# Patient Record
Sex: Female | Born: 1935 | Race: White | Hispanic: No | Smoking: Never smoker
Health system: Southern US, Community
[De-identification: ages and names within clinical notes are randomized; demographics above are authoritative.]

## PROBLEM LIST (undated history)

## (undated) DIAGNOSIS — E871 Hypo-osmolality and hyponatremia: Secondary | ICD-10-CM

## (undated) DIAGNOSIS — Z8781 Personal history of (healed) traumatic fracture: Secondary | ICD-10-CM

## (undated) DIAGNOSIS — I452 Bifascicular block: Secondary | ICD-10-CM

## (undated) DIAGNOSIS — R001 Bradycardia, unspecified: Secondary | ICD-10-CM

## (undated) DIAGNOSIS — I10 Essential (primary) hypertension: Secondary | ICD-10-CM

## (undated) DIAGNOSIS — G2581 Restless legs syndrome: Secondary | ICD-10-CM

## (undated) DIAGNOSIS — I4892 Unspecified atrial flutter: Secondary | ICD-10-CM

## (undated) DIAGNOSIS — I2699 Other pulmonary embolism without acute cor pulmonale: Secondary | ICD-10-CM

## (undated) DIAGNOSIS — D509 Iron deficiency anemia, unspecified: Secondary | ICD-10-CM

## (undated) HISTORY — DX: Personal history of (healed) traumatic fracture: Z87.81

## (undated) HISTORY — DX: Unspecified atrial flutter: I48.92

## (undated) HISTORY — DX: Other pulmonary embolism without acute cor pulmonale: I26.99

## (undated) HISTORY — DX: Bradycardia, unspecified: R00.1

## (undated) HISTORY — DX: Iron deficiency anemia, unspecified: D50.9

## (undated) HISTORY — DX: Essential (primary) hypertension: I10

## (undated) HISTORY — DX: Bifascicular block: I45.2

## (undated) HISTORY — PX: PACEMAKER INSERTION: SHX728

## (undated) HISTORY — DX: Restless legs syndrome: G25.81

## (undated) HISTORY — DX: Hypo-osmolality and hyponatremia: E87.1

---

## 2005-07-22 ENCOUNTER — Emergency Department (HOSPITAL_COMMUNITY): Admission: EM | Admit: 2005-07-22 | Discharge: 2005-07-22 | Payer: Self-pay | Admitting: Emergency Medicine

## 2009-04-25 ENCOUNTER — Ambulatory Visit: Payer: Self-pay | Admitting: Cardiology

## 2009-04-26 ENCOUNTER — Encounter: Payer: Self-pay | Admitting: Cardiology

## 2009-04-27 ENCOUNTER — Encounter: Payer: Self-pay | Admitting: Cardiology

## 2009-04-27 ENCOUNTER — Ambulatory Visit: Payer: Self-pay | Admitting: Cardiology

## 2009-05-07 ENCOUNTER — Ambulatory Visit: Payer: Self-pay | Admitting: Cardiology

## 2009-05-10 ENCOUNTER — Encounter: Payer: Self-pay | Admitting: Cardiology

## 2009-05-11 ENCOUNTER — Telehealth (INDEPENDENT_AMBULATORY_CARE_PROVIDER_SITE_OTHER): Payer: Self-pay | Admitting: *Deleted

## 2009-05-28 ENCOUNTER — Encounter: Payer: Self-pay | Admitting: Cardiology

## 2009-06-02 ENCOUNTER — Ambulatory Visit: Payer: Self-pay | Admitting: Cardiology

## 2009-06-02 DIAGNOSIS — I495 Sick sinus syndrome: Secondary | ICD-10-CM

## 2009-06-08 ENCOUNTER — Telehealth (INDEPENDENT_AMBULATORY_CARE_PROVIDER_SITE_OTHER): Payer: Self-pay | Admitting: *Deleted

## 2009-06-12 ENCOUNTER — Ambulatory Visit: Payer: Self-pay | Admitting: Internal Medicine

## 2009-06-16 ENCOUNTER — Encounter: Payer: Self-pay | Admitting: Internal Medicine

## 2009-06-16 ENCOUNTER — Telehealth: Payer: Self-pay | Admitting: Internal Medicine

## 2009-06-17 HISTORY — PX: OTHER SURGICAL HISTORY: SHX169

## 2009-06-19 ENCOUNTER — Inpatient Hospital Stay (HOSPITAL_COMMUNITY): Admission: RE | Admit: 2009-06-19 | Discharge: 2009-06-20 | Payer: Self-pay | Admitting: Internal Medicine

## 2009-06-19 ENCOUNTER — Ambulatory Visit: Payer: Self-pay | Admitting: Internal Medicine

## 2009-06-20 ENCOUNTER — Encounter: Payer: Self-pay | Admitting: Internal Medicine

## 2009-06-25 ENCOUNTER — Encounter: Payer: Self-pay | Admitting: Internal Medicine

## 2009-06-26 ENCOUNTER — Ambulatory Visit: Payer: Self-pay | Admitting: Internal Medicine

## 2009-09-18 ENCOUNTER — Ambulatory Visit: Payer: Self-pay | Admitting: Internal Medicine

## 2009-09-18 DIAGNOSIS — I10 Essential (primary) hypertension: Secondary | ICD-10-CM | POA: Insufficient documentation

## 2010-02-16 NOTE — Progress Notes (Signed)
Summary: Wants referral to our EP   Phone Note Call from Patient Call back at [facility] Phone [phone]   Summary of Call: Pt was seen by Dr. Diona Browner d/t low HR. She has been instructed not to drive d/t monitor results. Plan per OV note was for pt to see primary MD for referral to [facility] or [facility]. Pt now requesting referral to our EP docs in [place]. She states she can't do anything on May 21 or June 1st. She also states it is okay to left message on her voicemail. Pt notified nurse will d/w Dr. Diona Browner and notify her of his recommendation.  Initial call taken by: Cyril Loosen, RN, BSN,  Jun 08, 2009 4:42 PM  Follow-up for Phone Call        OK.  Can make referral to our EP team in [facility] for assessment of sick sinus syndrome and discussion of the possibility of pacemaker placement.  Please make sure that the Cardionet strips are all available for review at that visit. Follow-up by: Loreli Slot, MD, [facility],  Jun 10, 2009 9:34 AM  Additional Follow-up for Phone Call Additional follow up Details #1::        Spoke with Asher Muir (scheduler at [facility]) 1st available w/EP is July. Notified Asher Muir, pt needs appt ASAP. Asher Muir will d/w Dennis Bast, RN and notify of appt time when scheduled. Pt is aware that we are working on appt and will notify her when appt scheduled. Cyril Loosen, RN, BSN  Jun 10, 2009 11:15 AM  Per Asher Muir, Dr. Johney Frame agreed to see pt in [place] on 5/27. Left message for pt to call pt regarding appt. Additional Follow-up by: Cyril Loosen, RN, BSN,  Jun 10, 2009 2:16 PM    Additional Follow-up for Phone Call Additional follow up Details #2::    Pt aware of appt on Friday, May 27th at 2pm Follow-up by: Cyril Loosen, RN, BSN,  Jun 10, 2009 3:25 PM

## 2010-02-16 NOTE — Progress Notes (Signed)
Summary: low heart rate   Phone Note Call from Patient   Summary of Call: Pt. called c/o having issue with heart rate.  States she is currently wearing a cardionet monitor.  Went to nursing home for visit and had episode where her HR dropped to 46 per the nurses who checked her.  Stated she just felt like her heart felt heavy and legs want to give out.  Has OV with [facility] on 5/17.  Call back to pt. and informed her that we did check her cardionet strips, but no recent reports in yet regarding epidode above. Advised her that she should not be driving or home alone as pt. stated that she really did not want to go back to the ER.  States that she does not have any dizziness or SOB with any of these episodes.  She does state to me that she has fallen 12 times prior to wearing this monitor.   Hoover Brunette, LPN  May 11, 2009 1:36 PM   Follow-up for Phone Call        Cardionet strips currently available printed and scanned in for review. Cyril Loosen, RN, BSN  May 11, 2009 3:55 PM   Additional Follow-up for Phone Call Additional follow up Details #1::        See my appended note on available Cardionet strips. Additional Follow-up by: Loreli Slot, MD, [facility],  May 11, 2009 5:01 PM    Additional Follow-up for Phone Call Additional follow up Details #2::    Pt notified per Dr. Ival Bible note. She states she has to drive. Notified pt that she cannot drive given her symptoms and medical history. She has been notified that this is for her safety as well as everyone else's on the road. Pt verbalized understanding.  Follow-up by: Cyril Loosen, RN, BSN,  May 12, 2009 4:58 PM

## 2010-02-16 NOTE — Progress Notes (Signed)
Summary: PPM implant  Phone Note Outgoing Call Call back at [facility] Phone [phone]   Call placed by: Gypsy Balsam RN BSN,  Jun 16, 2009 1:13 PM Summary of Call: Called patient and left message on machine to call about PPM implant.  Scheduled for 06-19-09 at 2:30PM with Dr Johney Frame.  Need to review instructions with patient. Gypsy Balsam RN BSN  Jun 16, 2009 1:14 PM   Follow-up for Phone Call        Instructions reviewed with patient. Gypsy Balsam RN BSN  Jun 16, 2009 3:49 PM

## 2010-02-16 NOTE — Letter (Signed)
Summary: Implantable Device Instructions  Architectural technologist, Main Office  [address]   [place], [place] [address]   Phone: [phone]  Fax: [phone]      Implantable Device Instructions  You are scheduled for:  __X___ Permanent Transvenous Pacemaker _____ Implantable Cardioverter Defibrillator _____ Implantable Loop Recorder _____ Generator Change  on FRIDAY, June 19, 2009 with Dr. Johney Frame.  1.  Please arrive at the Short Stay Center at [facility] at 12:30 PM on the day of your procedure.  2.  Do not eat or drink after 8:30 AM the day of your procedure.  Ok to have clear liquids before 8:30 AM.   3.  Complete lab work on 06-13-09.  The lab at [facility] is open from 8:30 AM to 1:30 PM and from 2:30 PM to 5:00 PM.  The lab at [facility] is open from 7:30 AM to 5:30 PM.  You do not have to be fasting.  4.  OK to take medications with water morning of procedure.  5.  Plan for an overnight stay.  Bring your insurance cards and a list of your medications.  6.  Wash your chest and neck with antibacterial soap (any brand) the evening before and the morning of your procedure.  Rinse well.    *If you have ANY questions after you get home, please call the office [phone].  *Every attempt is made to prevent procedures from being rescheduled.  Due to the nauture of Electrophysiology, rescheduling can happen.  The physician is always aware and directs the staff when this occurs.

## 2010-02-16 NOTE — Assessment & Plan Note (Signed)
Summary: pc2/jml   Visit Type:  Pacemaker check Primary Provider:  Dr. Romeo Rabon   History of Present Illness: The patient presents today for routine electrophysiology followup. She reports doing very well since last being seen in our clinic.   Her energy has significantly improved.  Her primary concern today is backpain.  She has chronic pain in her back for which she has contemplated surgery in the past.  The patient denies symptoms of palpitations, chest pain, shortness of breath,  lower extremity edema, dizziness, presyncope, syncope, or neurologic sequela. The patient is tolerating medications without difficulties and is otherwise without complaint today.   Preventive Screening-Counseling & Management  Alcohol-Tobacco     Smoking Status: never  Current Medications (verified): 1)  Alendronate Sodium 70 Mg Tabs (Alendronate Sodium) .... Take One By Mouth Weekly 2)  Amlodipine Besylate 10 Mg Tabs (Amlodipine Besylate) .... Take 1 Tablet By Mouth Once A Day 3)  Aspir-Trin 81 Mg Tbec (Aspirin) .... Take 2-4 Tablet By Mouth Once A Day 4)  Citalopram Hydrobromide 40 Mg Tabs (Citalopram Hydrobromide) .... Take 1 Tablet By Mouth Once A Day 5)  Clonazepam 1 Mg Tabs (Clonazepam) .... Take 1 Tablet By Mouth Once A Day 6)  Fish Oil 1000 Mg Caps (Omega-3 Fatty Acids) .... Take 2 Tablet By Mouth Once A Day 7)  Multivitamins  Tabs (Multiple Vitamin) .... Take 1 Tablet By Mouth Once A Day 8)  Primidone 50 Mg Tabs (Primidone) .... Take 1 Tablet By Mouth Once A Day 9)  Terazosin Hcl 2 Mg Caps (Terazosin Hcl) .... Take 1 Tablet By Mouth Once A Day 10)  Hydrochlorothiazide 12.5 Mg Caps (Hydrochlorothiazide) .... Take 1 Tablet By Mouth Once A Day 11)  Tylenol Extra Strength 500 Mg Tabs (Acetaminophen) .... As Needed 12)  Cyclobenzaprine Hcl 5 Mg Tabs (Cyclobenzaprine Hcl) .... Take 1 Tablet By Mouth Once A Day At Bedtime As Needed 13)  Amitiza 24 Mcg Caps (Lubiprostone) .... Take 1 Tablet By Mouth  Two Times A Day As Needed 14)  Multimineral Plus  Tabs (Multiple Vitamins-Minerals) .... Take 1 Tablet By Mouth Once A Day  Allergies (verified): 1)  ! Quinine 2)  ! * Tramadol 3)  ! Epinephrine 4)  ! Celebrex  Comments:  Nurse/Medical Assistant: The patient's medication list and allergies were reviewed with the patient and were updated in the Medication and Allergy Lists.  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 Head:  normocephalic and atraumatic Eyes:  PERRLA/EOM intact; conjunctiva and lids normal. Mouth:  Teeth, gums and palate normal. Oral mucosa normal. Neck:  supple Chest Wall:  pacemaker pocket is well healed Lungs:  Clear bilaterally to auscultation and percussion. Heart:  Non-displaced PMI, chest non-tender; regular rate and rhythm, S1, S2 without murmurs, rubs or gallops. Carotid upstroke normal, no bruit. Normal abdominal aortic size, no bruits. Femorals normal pulses, no bruits. Pedals normal pulses. No edema, no varicosities. Abdomen:  Bowel sounds positive; abdomen soft and non-tender without masses, organomegaly, or hernias noted. No hepatosplenomegaly. Pulses:  pulses normal in all 4 extremities Extremities:  No clubbing or cyanosis. Neurologic:  Alert and oriented x 3.   PPM Specifications Following MD:  Hillis Range, MD     PPM Vendor:  St Jude     PPM Model Number:  [id number]     PPM Serial Number:  [id number] DOI:  06/19/2009     PPM Implanting MD:  Hillis Range,  MD  Lead 1    Location: RA     DOI: 06/19/2009     Model #: 1888TC     Serial #: [id number]     Status: active Lead 2    Location: RV     DOI: 06/19/2009     Model #: 1888TC     Serial #: [id number]     Status: active  Magnet Response Rate:  BOL 100 ERI  85  Indications:  Sick sinus syndrome   PPM Follow Up Battery Voltage:  2.99 V     Battery Est. Longevity:  8.6-10.7 yrs     Pacer Dependent:  Yes       PPM Device Measurements Atrium  Amplitude: 4.7 mV, Impedance: 410 ohms, Threshold: 0.75 V at 0.5 msec Right Ventricle  Amplitude: 10.8 mV, Impedance: 410 ohms, Threshold: 0.75 V at 0.5 msec  Episodes MS Episodes:  3     Percent Mode Switch:  <1%     Coumadin:  No Ventricular High Rate:  0     Atrial Pacing:  52%     Ventricular Pacing:  <1%  Parameters Mode:  DDDR     Lower Rate Limit:  60     Upper Rate Limit:  110 Paced AV Delay:  250     Sensed AV Delay:  250 Next Cardiology Appt Due:  06/18/2010 Tech Comments:  NORMAL DEVICE FUNCTION.  CHANGED RA OUTPUT FROM 3.5 TO 2.0 AND RV OUTPUT FROM 3.5 TO 2.5 V.  ROV IN JUNE W/JA. Vella Kohler  September 18, 2009 3:27 PM MD Comments:  agree  Impression & Recommendations:  Problem # 1:  SICK SINUS SYNDROME (ICD-427.81) stable normal pacemaker function changes as above  Problem # 2:  ESSENTIAL HYPERTENSION, BENIGN (ICD-401.1) stable no changes today  Patient Instructions: 1)  return 6/12

## 2010-02-16 NOTE — Procedures (Signed)
Summary: wound check/ per dr Io Dieujuste/jml   Current Medications (verified): 1)  Alendronate Sodium 70 Mg Tabs (Alendronate Sodium) .... Take One By Mouth Weekly 2)  Amlodipine Besylate 10 Mg Tabs (Amlodipine Besylate) .... Take 1 Tablet By Mouth Once A Day 3)  Aspir-Trin 81 Mg Tbec (Aspirin) .... Take 2 Tablet By Mouth Once A Day 4)  Citalopram Hydrobromide 40 Mg Tabs (Citalopram Hydrobromide) .... Take 1 Tablet By Mouth Once A Day 5)  Clonazepam 0.5 Mg Tabs (Clonazepam) .... Take 1/2  Tablet By Mouth At Bedtime 6)  Fish Oil 1000 Mg Caps (Omega-3 Fatty Acids) .... Take 2 Tablet By Mouth Once A Day 7)  Multivitamins  Tabs (Multiple Vitamin) .... Take 1 Tablet By Mouth Once A Day 8)  Primidone 50 Mg Tabs (Primidone) .... Take 1 Tablet By Mouth Once A Day 9)  Terazosin Hcl 2 Mg Caps (Terazosin Hcl) .... Take 1 Tablet By Mouth Once A Day 10)  Hydrochlorothiazide 12.5 Mg Caps (Hydrochlorothiazide) .... Take 1 Tablet By Mouth Once A Day 11)  Cvs Leg Cramps .... Take 1 Tablet By Mouth Once A Day As Needed 12)  Coral Calcium 500 Mg Caps (Coral Calcium) .... Take 1 Tablet By Mouth Once A Day 13)  Tylenol Extra Strength 500 Mg Tabs (Acetaminophen) .... As Needed 14)  Cyclobenzaprine Hcl 5 Mg Tabs (Cyclobenzaprine Hcl) .... Take 1 Tablet By Mouth Once A Day At Bedtime  Allergies (verified): 1)  ! Quinine 2)  ! * Tramadol 3)  ! Epinephrine 4)  ! Celebrex  PPM Specifications Following MD:  Hillis Range, MD     PPM Vendor:  St Jude     PPM Model Number:  [phone]     PPM Serial Number:  [id number] PPM DOI:  06/19/2009     PPM Implanting MD:  Hillis Range, MD  Lead 1    Location: RA     DOI: 06/19/2009     Model #: 1888TC     Serial #: [id number]     Status: active Lead 2    Location: RV     DOI: 06/19/2009     Model #: 1888TC     Serial #: [id number]     Status: active  Magnet Response Rate:  BOL 100 ERI  85  Indications:  Sick sinus syndrome   PPM Follow Up Remote Check?  No Battery Voltage:  3.04  V     Battery Est. Longevity:  5.0 years     Pacer Dependent:  Yes       PPM Device Measurements Atrium  Amplitude: 2.0 mV, Impedance: 390 ohms, Threshold: 0.75 V at 0.5 msec Right Ventricle  Amplitude: 10.2 mV, Impedance: 450 ohms, Threshold: 0.75 V at 0.5 msec  Episodes MS Episodes:  0     Percent Mode Switch:  0     Coumadin:  No Atrial Pacing:  64%     Ventricular Pacing:  1.6%  Parameters Mode:  DDDR     Lower Rate Limit:  60     Upper Rate Limit:  110 Paced AV Delay:  250     Sensed AV Delay:  250 Tech Comments:  No parameter changes.  Device function normal.  Steri strips removed.  No redness, small amount edema noted.  Activity restrictions reviewed with the patient.  ROV 3 months with Dr. Johney Frame in [place]. Altha Harm, LPN  June 26, 2009 11:42 AM

## 2010-02-16 NOTE — Procedures (Signed)
Summary: Holter and Event/ CARDIONET END OF SERVICE SUMMARY  Holter and Event/ CARDIONET END OF SERVICE SUMMARY   Imported By: Dorise Hiss 06/09/2009 10:58:57  _____________________________________________________________________  External Attachment:    Type:   Image     Comment:   External Document

## 2010-02-16 NOTE — Assessment & Plan Note (Signed)
Summary: NEW EVAL; SSS; DISCUSS PACER-JM   Visit Type:  Initial Consult Primary Provider:  Dr. Romeo Rabon   History of Present Illness: Amber [name] is a pleasant 77 year old WF with symptomatic bradycardia who presents today for EP consultation.  She was seen in consultation by Dr Diona Browner back in April at [facility] with a history of bradycardia and reported falls/syncope. She had undergone prior evaluation by Dr. Hyacinth Meeker in [place]. Followup echocardiography is reviewed below, revealing normal LVEF without major abnormalities. She reports that over the past year, she has had worsening fatigue.  She reports intermittent episodes of weakness.  She reports presyncope for which she has fallen.  She denies frank syncope. The patient was provided a CardioNet monitor. She had episodes of bradycardia, specifically junctional rhythm with heart rates into the 30s, some pauses no greater than 2.2 seconds, and brief bursts of PACs and atrial tachycardia, as well as a single episode of NSVT that was asymptomatic. She denies CP, palpitations, SOB, or other concerns.  Preventive Screening-Counseling & Management  Alcohol-Tobacco     Smoking Status: never  Current Medications (verified): 1)  Alendronate Sodium 70 Mg Tabs (Alendronate Sodium) .... Take One By Mouth Weekly 2)  Amlodipine Besylate 5 Mg Tabs (Amlodipine Besylate) .... Take 1 Tablet By Mouth Once A Day 3)  Aspir-Trin 325 Mg Tbec (Aspirin) .... Take 1 Tablet By Mouth Once A Day 4)  Citalopram Hydrobromide 40 Mg Tabs (Citalopram Hydrobromide) .... Take 1 Tablet By Mouth Once A Day 5)  Clonazepam 0.5 Mg Tabs (Clonazepam) .... Take 1 Tablet By Mouth Once A Day\par 6)  Fish Oil 1000 Mg Caps (Omega-3 Fatty Acids) .... Take 2 Tablet By Mouth Once A Day 7)  Multivitamins  Tabs (Multiple Vitamin) .... Take 1 Tablet By Mouth Once A Day 8)  Primidone 50 Mg Tabs (Primidone) .... Take 1 Tablet By Mouth Once A Day 9)  Terazosin Hcl 2 Mg  Caps (Terazosin Hcl) .... Take 1 Tablet By Mouth Once A Day 10)  Hydrochlorothiazide 12.5 Mg Caps (Hydrochlorothiazide) .... Take 1 Tablet By Mouth Once A Day 11)  Cvs Leg Cramps .... Take 1 Tablet By Mouth Once A Day As Needed 12)  Coral Calcium 500 Mg Caps (Coral Calcium) .... Take 1 Tablet By Mouth Once A Day 13)  Tylenol Extra Strength 500 Mg Tabs (Acetaminophen) .... As Needed 14)  Cyclobenzaprine Hcl 5 Mg Tabs (Cyclobenzaprine Hcl) .... Take 1 Tablet By Mouth Once A Day At Bedtime  Allergies (verified): 1)  ! Quinine 2)  ! * Tramadol 3)  ! Epinephrine 4)  ! Celebrex  Comments:  Nurse/Medical Assistant: The patient is currently on medications but does not know the name or dosage at this time. Instructed to contact our office with details. Will update medication list at that time.  Past History:  Past Medical History: Hypertension Restless leg syndrome Bradycardia - followed by Dr. Hyacinth Meeker in past [facility]) NSVT Vertebral fractures  Past Surgical History: Reviewed history from 06/01/2009 and no changes required. Right lower leg vein stripping  Family History: Reviewed history from 06/01/2009 and no changes required. Noncontributory  Social History: Reviewed history from 06/01/2009 and no changes required. Lives in [place] Tobacco Use - No Alcohol Use - no Regular Exercise - yes  Review of Systems       All systems are reviewed and negative except as listed in the HPI.   Vital Signs:  Patient profile:   77 year old female Height:  61 inches Weight:      133 pounds Pulse rate:   56 / minute BP sitting:   138 / 64  (left arm) Cuff size:   regular  Vitals Entered By: Carlye Grippe (Jun 12, 2009 1:45 PM)  Physical Exam  General:  Well developed, well nourished, in no acute distress. Head:  normocephalic and atraumatic Eyes:  PERRLA/EOM intact; conjunctiva and lids normal. Nose:  no deformity, discharge, inflammation, or lesions Mouth:  Teeth, gums  and palate normal. Oral mucosa normal. Neck:  Neck supple, no JVD. No masses, thyromegaly or abnormal cervical nodes. Lungs:  Clear bilaterally to auscultation and percussion. Heart:  Non-displaced PMI, chest non-tender; regular rate and rhythm, S1, S2 without murmurs, rubs or gallops. Carotid upstroke normal, no bruit. Normal abdominal aortic size, no bruits. Femorals normal pulses, no bruits. Pedals normal pulses. No edema, no varicosities. Abdomen:  Bowel sounds positive; abdomen soft and non-tender without masses, organomegaly, or hernias noted. No hepatosplenomegaly. Msk:  Back normal, normal gait. Muscle strength and tone normal. Pulses:  pulses normal in all 4 extremities Extremities:  No clubbing or cyanosis. Neurologic:  Alert and oriented x 3. Skin:  Intact without lesions or rashes. Cervical Nodes:  no significant adenopathy Psych:  Normal affect.   Echocardiogram  Procedure date:  04/27/2009  Findings:      LVEF 60-65%, mild right ventricular enlargement, moderate RAE, trace MR, trace AR, mild TR, trivial pericardial effusion.    EKG  Procedure date:  04/26/2009  Findings:      sinus rhythm PR 200, incomplete RBBB (QRS 128), inferior infarction  Impression & Recommendations:  Problem # 1:  SICK SINUS SYNDROME (ICD-427.81) The patient has symptomatic bradycardia with fatigue and weakness.  She has had presyncope and multiple falls.  I have reviewed her event monitor which reveals junctional bradycardia with heart rates at times in the 30s.  TFTs are normal.   I would therefore recommend pacemaker implantation.  Risks, benefits, alternatives to pacemaker implantation were discussed in detail with the patient today. She understands that the risks include but are not limited to bleeding, infection, pneumothorax, perforation, tamponade, vascular damage, renal failure, MI, stroke, death, and lead dislodgement.   She accepts these risks and wishes to proceed.  We will therefore  plan PPM implant at the next available time.  Problem # 2:  VENTRICULAR TACHYCARDIA (ICD-427.1) On monitor, she had nonsustained VT without symptoms.  Her EF is preserved.  I do not feel this is the cause for the above symptoms.  We could consider beta blocker therapy once pacemaker is implanted.  Other Orders: T-Basic Metabolic Panel [phone]) T-CBC w/Diff [phone]) T-Protime, Auto [id number]) T-PTT [phone])

## 2010-02-16 NOTE — Assessment & Plan Note (Signed)
Summary: EPH- POST [facility]   Visit Type:  Follow-up Primary Provider:  Dr. Romeo Rabon   History of Present Illness: 77 year old woman presents for a followup visit. She was seen in consultation back in April at [facility] with a history of bradycardia and reported falls/syncope. She had undergone prior evaluation by Dr. Hyacinth Meeker in [place]. Followup echocardiography is reviewed below, revealing normal LVEF without major abnormalities.  The patient was provided a CardioNet monitor. She had episodes of bradycardia, specifically junctional rhythm with heart rates into the 30s, some pauses no greater than 2.2 seconds, and brief bursts of PACs and atrial tachycardia, as well as what appears to be a single episode of NSVT that was asymptomatic. She does not report any frank syncope during the time that she was wearing a monitor although did have some "spells" when she felt very weak. She denies any anginal chest pain. She has NYHA class II dyspnea and exertion.  Reviewed with her in detail the results of her testing to date. She likely has conduction system disease and an element of sick sinus syndrome, perhaps contributing to at least some of her symptomatology. The details of her falls and possible syncope are not entirely clear, however it is possible that she has had more profound bradycardia or longer pauses at these times.  I spoke with her about the possibility of proceeding with a stress test to rule out ischemic heart disease in light of documentation of asymptomatic nonsustained ventricular tachycardia with normal LVEF. We also discussed referral to one of our electrophysiologists to review the potential possibility of pacemaker implantation.  After considering the matter, she stated that she preferred to speak with Dr. Oscar La, and be referred for further evaluation at either [facility] or [facility]. She was for some reason hesitant to have an evaluation done in [place], the  site of our primary practice.  Clinical Review Panels:  Echocardiogram Echocardiogram LVEF 60-65%, mild right ventricular enlargement, moderate RAE, trace MR, trace AR, mild TR, trivial pericardial effusion. (04/27/2009)    Preventive Screening-Counseling & Management  Alcohol-Tobacco     Smoking Status: never  Current Medications (verified): 1)  Alendronate Sodium 70 Mg Tabs (Alendronate Sodium) .... Take One By Mouth Weekly 2)  Amlodipine Besylate 5 Mg Tabs (Amlodipine Besylate) .... Take 1 Tablet By Mouth Once A Day 3)  Aspir-Trin 325 Mg Tbec (Aspirin) .... Take 1 Tablet By Mouth Once A Day 4)  Citalopram Hydrobromide 40 Mg Tabs (Citalopram Hydrobromide) .... Take 1 Tablet By Mouth Once A Day 5)  Clonazepam 0.5 Mg Tabs (Clonazepam) .... Take 1 Tablet By Mouth Once A Day\par 6)  Fish Oil 1000 Mg Caps (Omega-3 Fatty Acids) .... Take 2 Tablet By Mouth Once A Day 7)  Multivitamins  Tabs (Multiple Vitamin) .... Take 1 Tablet By Mouth Once A Day 8)  Primidone 50 Mg Tabs (Primidone) .... Take 1 Tablet By Mouth Once A Day 9)  Terazosin Hcl 2 Mg Caps (Terazosin Hcl) .... Take 1 Tablet By Mouth Once A Day 10)  Hydrochlorothiazide 12.5 Mg Caps (Hydrochlorothiazide) .... Take 1 Tablet By Mouth Once A Day 11)  Cvs Leg Cramps .... Take 1 Tablet By Mouth Once A Day As Needed 12)  Coral Calcium 500 Mg Caps (Coral Calcium) .... Take 1 Tablet By Mouth Once A Day 13)  Tylenol Extra Strength 500 Mg Tabs (Acetaminophen) .... As Needed  Allergies (verified): 1)  ! Quinine 2)  ! * Tramadol 3)  ! Epinephrine 4)  !  Celebrex  Comments:  Nurse/Medical Assistant: The patient's medications and allergies were reviewed with the patient and were updated in the Medication and Allergy Lists. List reviewed.  Past History:  Past Medical History: Last updated: 06/01/2009 Hypertension Restless leg syndrome Bradycardia - followed by Dr. Hyacinth Meeker in past [facility]) Syncope Vertebral fractures  Past  Surgical History: Last updated: 06/01/2009 Right lower leg vein stripping  Family History: Last updated: 06/01/2009 Noncontributory  Social History: Last updated: 06/01/2009 Lives in [place] Tobacco Use - No Alcohol Use - no Regular Exercise - yes  Review of Systems  The patient denies anorexia, fever, weight loss, chest pain, syncope, peripheral edema, prolonged cough, headaches, abdominal pain, melena, hematochezia, and severe indigestion/heartburn.         She cites problems with restless legs. Otherwise reviewed and negative except as outlined.  Vital Signs:  Patient profile:   77 year old female Height:      61 inches Weight:      134 pounds BMI:     25.41 Pulse rate:   60 / minute BP sitting:   159 / 74  (left arm) Cuff size:   regular  Vitals Entered By: Carlye Grippe (Jun 02, 2009 9:56 AM)  Physical Exam  Additional Exam:  Well-nourished elderly woman in no acute distress. HEENT: Conjunctiva and lids normal, oropharynx clear. Neck: Supple, no elevated JVP. Lungs: Clear to auscultation, nonlabored. Cardiac: Regular rate and rhythm, soft systolic murmur at the base, preserved second heart sound, no S3. Abdomen: Soft, nontender, bowel sounds present. Skin: Warm and dry. Extremities: No pitting edema, distal pulses 2+. Musculoskeletal: No gross deformities. Neuropsychiatric: Alert and oriented x3, affect grossly appropriate.    Impression & Recommendations:  Problem # 1:  SICK SINUS SYNDROME (ICD-427.81)  CardioNet monitor reveals evidence of episodic bradycardia, specifically with junctional rhythm in the 30s, some pauses no greater than 2.2 seconds, and brief bursts of atrial tachycardia. Certainly suspicious for conduction system disease, and potentially a contributor to some of her spells including episodic weakness and reported history of falls with potential syncope. She denies having any frank syncope during the time she wore the monitor. It is  possible that we did not capture more profound episodes of bradycardia or longer pulses. I reviewed this with her in detail and recommended that she have referral to one of our electrophysiologists to discuss the potential for pacemaker implantation (perhaps even EPS with SSS and apparent NSVT). She states that she would prefer to have evaluation done either through [facility] or [facility]. I recommended that she see her primary care physician Dr. Oscar La to make a referral. We can certainly send records of her evaluation to date if requested. Otherwise if she changes her mind, we can certainly see her back and proceed from there. I have reminded her that she should ideally not be driving with this current history.  Her updated medication list for this problem includes:    Amlodipine Besylate 5 Mg Tabs (Amlodipine besylate) .Marland Kitchen... Take 1 tablet by mouth once a day    Aspir-trin 325 Mg Tbec (Aspirin) .Marland Kitchen... Take 1 tablet by mouth once a day  Problem # 2:  VENTRICULAR TACHYCARDIA (ICD-427.1)  Possibly an aberrant SVT, although asymptomatic NSVT appears to be the case. LVEF is normal by recent echocardiogram. I did discuss with her proceeding with a basic ischemic evaluation via stress testing. She is not reporting any angina. She was hesitant to pursue this as well. Perhaps this can be further reviewed with  her in electrophysiology followup at the facility of her choice.  Her updated medication list for this problem includes:    Amlodipine Besylate 5 Mg Tabs (Amlodipine besylate) .Marland Kitchen... Take 1 tablet by mouth once a day    Aspir-trin 325 Mg Tbec (Aspirin) .Marland Kitchen... Take 1 tablet by mouth once a day  Patient Instructions: 1)  Follow up with Dr. Oscar La for referral to either [facility] or [facility] at patient request. 2)  Follow up with [place] if needed.

## 2010-02-16 NOTE — Miscellaneous (Signed)
Summary: Device preload  Clinical Lists Changes  Observations: Added new observation of PPM INDICATN: Sick sinus syndrome (06/25/2009 13:23) Added new observation of MAGNET RTE: BOL 100 ERI  85 (06/25/2009 13:23) Added new observation of PPMLEADSTAT2: active (06/25/2009 13:23) Added new observation of PPMLEADSER2: [id number] (06/25/2009 13:23) Added new observation of PPMLEADMOD2: 1888TC (06/25/2009 13:23) Added new observation of PPMLEADLOC2: RV (06/25/2009 13:23) Added new observation of PPMLEADSTAT1: active (06/25/2009 13:23) Added new observation of PPMLEADSER1: [id number] (06/25/2009 13:23) Added new observation of PPMLEADMOD1: 1888TC (06/25/2009 13:23) Added new observation of PPMLEADLOC1: RA (06/25/2009 13:23) Added new observation of PPM IMP MD: Hillis Range, MD (06/25/2009 13:23) Added new observation of PPMLEADDOI2: 06/19/2009 (06/25/2009 13:23) Added new observation of PPMLEADDOI1: 06/19/2009 (06/25/2009 13:23) Added new observation of PPM DOI: 06/19/2009 (06/25/2009 13:23) Added new observation of PPM SERL#: [id number]  (06/25/2009 13:23) Added new observation of PPM MODL#: [id number]  (06/25/2009 44:03) Added new observation of PACEMAKERMFG: St Jude  (06/25/2009 13:23) Added new observation of PACEMAKER MD: Hillis Range, MD  (06/25/2009 13:23)      PPM Specifications Following MD:  Hillis Range, MD     PPM Vendor:  St Jude     PPM Model Number:  [id number]     PPM Serial Number:  [id number] PPM DOI:  06/19/2009     PPM Implanting MD:  Hillis Range, MD  Lead 1    Location: RA     DOI: 06/19/2009     Model #: 1888TC     Serial #: [id number]     Status: active Lead 2    Location: RV     DOI: 06/19/2009     Model #: 1888TC     Serial #: [id number]     Status: active  Magnet Response Rate:  BOL 100 ERI  85  Indications:  Sick sinus syndrome

## 2010-02-16 NOTE — Cardiovascular Report (Signed)
Summary: Card Device Clinic/ [facility] SUMMARY  Card Device Clinic/ [facility] SUMMARY   Imported By: Dorise Hiss 09/24/2009 10:38:03  _____________________________________________________________________  External Attachment:    Type:   Image     Comment:   External Document

## 2010-02-16 NOTE — Procedures (Signed)
Summary: Cardionet Strips   Cardionet Strips   Imported By: Cyril Loosen, RN, BSN 05/11/2009 15:54:51  _____________________________________________________________________  External Attachment:    Type:   Image     Comment:   External Document  Appended Document: Cardionet Strips  Reviewed - some sinus brady with only 2.1 second pause (asymptomatic ?) and one brief episode of junctional or idioventricular rhythm.  No syncope.  Conduction disease is suspected with OV already arranged.  Continue monitoring.  No driving.  Appended Document: Cardionet Strips     Pt notified per Dr. Ival Bible note. She states she has to drive. Notified pt that she cannot drive given her symptoms and medical history. She has been notified that this is for her safety as well as everyone else's on the road. Pt verbalized understanding.  Follow-up by: Cyril Loosen, RN, BSN,  May 12, 2009 4:58 PM

## 2010-02-16 NOTE — Cardiovascular Report (Signed)
Summary: Office Visit   Office Visit   Imported By: Roderic Ovens 07/17/2009 15:51:12  _____________________________________________________________________  External Attachment:    Type:   Image     Comment:   External Document

## 2010-02-16 NOTE — Consult Note (Signed)
Summary: CARDIOLOGY CONSULT/ [facility]  CARDIOLOGY CONSULT/ [facility]   Imported By: Zachary George 06/01/2009 16:23:09  _____________________________________________________________________  External Attachment:    Type:   Image     Comment:   External Document

## 2010-04-05 LAB — SURGICAL PCR SCREEN: Staphylococcus aureus: NEGATIVE

## 2010-06-23 ENCOUNTER — Encounter: Payer: Self-pay | Admitting: *Deleted

## 2010-06-24 ENCOUNTER — Ambulatory Visit (INDEPENDENT_AMBULATORY_CARE_PROVIDER_SITE_OTHER): Payer: Medicare Other | Admitting: Internal Medicine

## 2010-06-24 ENCOUNTER — Encounter: Payer: Self-pay | Admitting: Internal Medicine

## 2010-06-24 DIAGNOSIS — I10 Essential (primary) hypertension: Secondary | ICD-10-CM

## 2010-06-24 DIAGNOSIS — I495 Sick sinus syndrome: Secondary | ICD-10-CM

## 2010-06-24 NOTE — Assessment & Plan Note (Signed)
Normal pacemaker function See Pace Art report No changes today  

## 2010-06-24 NOTE — Assessment & Plan Note (Signed)
Stable No change required today  

## 2010-06-24 NOTE — Progress Notes (Signed)
The patient presents today for routine electrophysiology followup.  Since last being seen in our clinic, the patient reports doing very well.  Today, she denies symptoms of palpitations, chest pain, shortness of breath, orthopnea, PND, lower extremity edema, dizziness, presyncope, syncope, or neurologic sequela.  The patient feels that she is tolerating medications without difficulties and is otherwise without complaint today.   Past Medical History  Diagnosis Date  . Hypertension   . Restless leg syndrome   . 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 History of recurrent vertebral fractures   . Right bundle branch block and left posterior fascicular block    Past Surgical History  Procedure Date  . Right lower leg vein stripping   . Sjm ppm 06/11    by JA for SSS and syncope    Current Outpatient Prescriptions  Medication Sig Dispense Refill  . amLODipine (NORVASC) 10 MG tablet Take 10 mg by mouth daily.        . citalopram (CELEXA) 40 MG tablet Take 40 mg by mouth daily.        . clonazePAM (KLONOPIN) 1 MG tablet Take 1 mg by mouth daily.        Marland Kitchen docusate sodium (COLACE) 50 MG capsule Take by mouth 2 (two) times daily.        . hydrochlorothiazide (,MICROZIDE/HYDRODIURIL,) 12.5 MG capsule Take 12.5 mg by mouth daily.        . Melatonin 5 MG TABS Take 2 tablets by mouth at bedtime.        . Multiple Vitamin (MULTIVITAMIN) tablet Take 1 tablet by mouth daily.        . primidone (MYSOLINE) 50 MG tablet Take 50 mg by mouth daily.        Marland Kitchen terazosin (HYTRIN) 2 MG capsule Take 2 mg by mouth at bedtime.        Marland Kitchen DISCONTD: clonazePAM (KLONOPIN) 1 MG tablet Take 1 mg by mouth daily.        Marland Kitchen DISCONTD: acetaminophen (TYLENOL) 500 MG tablet Take 500 mg by mouth daily.        Marland Kitchen DISCONTD: alendronate (FOSAMAX) 70 MG tablet Take 70 mg by mouth every 7 (seven) days. Take with a full glass of water on an empty stomach.       . DISCONTD: aspirin 81 MG tablet Take 81 mg by mouth daily. Takes 2-4 daily        . DISCONTD: cyclobenzaprine (FLEXERIL) 5 MG tablet Take 5 mg by mouth daily as needed.        Marland Kitchen DISCONTD: fish oil-omega-3 fatty acids 1000 MG capsule Take 2 g by mouth daily.        Marland Kitchen DISCONTD: lubiprostone (AMITIZA) 24 MCG capsule Take 24 mcg by mouth 2 (two) times daily with a meal.        . DISCONTD: Multiple Vitamins-Minerals (MULTIMINERAL PLUS PO) Take 1 tablet by mouth daily.          Allergies  Allergen Reactions  . Celecoxib     REACTION: hearing loss  . Epinephrine   . Quinine     REACTION: rash  . Tramadol     History   Social History  . Marital Status: Married    Spouse Name: N/A    Number of Children: N/A  . Years of Education: N/A   Occupational History  . RETIRED    Social History Main Topics  . Smoking status: Never Smoker   . Smokeless  tobacco: Never Used  . Alcohol Use: No  . Drug Use: No  . Sexually Active: Not on file   Other Topics Concern  . Not on file   Social History Narrative   Lives in [place].Spouse is a disabled minister s/p CVA.  He now resides in a nursing home in [place].   Physical Exam: Filed Vitals:   06/24/10 1525  BP: 130/70  Pulse: 62  Height: 5\' 1"  (1.549 m)  Weight: 128 lb (58.06 kg)    GEN- The patient is well appearing, alert and oriented x 3 today.   Head- normocephalic, atraumatic Eyes-  Sclera clear, conjunctiva pink Ears- hearing intact Oropharynx- clear Neck- supple, no JVP Lymph- no cervical lymphadenopathy Lungs- Clear to ausculation bilaterally, normal work of breathing Chest- pacemaker pocket is well healed Heart- Regular rate and rhythm, no murmurs, rubs or gallops, PMI not laterally displaced GI- soft, NT, ND, + BS Extremities- no clubbing, cyanosis, or edema MS- no significant deformity or atrophy Skin- no rash or lesion Psych- euthymic mood, full affect Neuro- strength and sensation are intact  Pacemaker interrogation- reviewed in detail today,  See PACEART report  Assessment and  Plan:

## 2010-09-23 ENCOUNTER — Other Ambulatory Visit: Payer: Self-pay

## 2010-09-23 ENCOUNTER — Encounter: Payer: Self-pay | Admitting: Internal Medicine

## 2010-09-23 ENCOUNTER — Ambulatory Visit (INDEPENDENT_AMBULATORY_CARE_PROVIDER_SITE_OTHER): Payer: Medicare Other | Admitting: *Deleted

## 2010-09-23 DIAGNOSIS — I495 Sick sinus syndrome: Secondary | ICD-10-CM

## 2010-09-23 LAB — REMOTE PACEMAKER DEVICE
ATRIAL PACING PM: 69
BAMS-0003: 70 {beats}/min
RV LEAD IMPEDENCE PM: 390 Ohm
VENTRICULAR PACING PM: 1

## 2010-10-04 ENCOUNTER — Encounter: Payer: Self-pay | Admitting: *Deleted

## 2010-10-04 NOTE — Progress Notes (Signed)
Pacer remote check  

## 2010-12-23 ENCOUNTER — Ambulatory Visit (INDEPENDENT_AMBULATORY_CARE_PROVIDER_SITE_OTHER): Payer: Medicare Other | Admitting: *Deleted

## 2010-12-23 DIAGNOSIS — I495 Sick sinus syndrome: Secondary | ICD-10-CM

## 2010-12-24 ENCOUNTER — Encounter: Payer: Self-pay | Admitting: Internal Medicine

## 2010-12-24 ENCOUNTER — Other Ambulatory Visit: Payer: Self-pay | Admitting: Internal Medicine

## 2010-12-24 LAB — REMOTE PACEMAKER DEVICE
AL AMPLITUDE: 3.8 mv
AL IMPEDENCE PM: 430 Ohm
BAMS-0003: 70 {beats}/min
BATTERY VOLTAGE: 2.99 V
DEVICE MODEL PM: 7136330
RV LEAD AMPLITUDE: 7.8 mv
RV LEAD IMPEDENCE PM: 400 Ohm

## 2011-01-06 NOTE — Progress Notes (Signed)
Remote pacer check  

## 2011-01-13 ENCOUNTER — Encounter: Payer: Self-pay | Admitting: *Deleted

## 2011-03-31 ENCOUNTER — Encounter: Payer: Self-pay | Admitting: Internal Medicine

## 2011-03-31 ENCOUNTER — Ambulatory Visit (INDEPENDENT_AMBULATORY_CARE_PROVIDER_SITE_OTHER): Payer: Medicare Other | Admitting: *Deleted

## 2011-03-31 DIAGNOSIS — I495 Sick sinus syndrome: Secondary | ICD-10-CM

## 2011-03-31 LAB — REMOTE PACEMAKER DEVICE
ATRIAL PACING PM: 53
RV LEAD IMPEDENCE PM: 440 Ohm
VENTRICULAR PACING PM: 1

## 2011-04-05 ENCOUNTER — Encounter: Payer: Self-pay | Admitting: *Deleted

## 2011-04-07 NOTE — Progress Notes (Signed)
Remote pacer check  

## 2011-07-14 ENCOUNTER — Ambulatory Visit (INDEPENDENT_AMBULATORY_CARE_PROVIDER_SITE_OTHER): Payer: Medicare Other | Admitting: Internal Medicine

## 2011-07-14 ENCOUNTER — Encounter: Payer: Self-pay | Admitting: Internal Medicine

## 2011-07-14 VITALS — BP 154/76 | HR 69 | Resp 18 | Ht 61.0 in | Wt 125.0 lb

## 2011-07-14 DIAGNOSIS — I10 Essential (primary) hypertension: Secondary | ICD-10-CM

## 2011-07-14 DIAGNOSIS — I495 Sick sinus syndrome: Secondary | ICD-10-CM

## 2011-07-14 LAB — PACEMAKER DEVICE OBSERVATION
AL AMPLITUDE: 3.8 mv
AL THRESHOLD: 1 V
RV LEAD IMPEDENCE PM: 375 Ohm
RV LEAD THRESHOLD: 1 V

## 2011-07-14 NOTE — Assessment & Plan Note (Signed)
Normal pacemaker function See Pace Art report No changes today  

## 2011-07-14 NOTE — Progress Notes (Signed)
PCP: Romeo Rabon, MD  Amber [name] is a 78 y.o. female who presents today for routine electrophysiology followup.  Since last being seen in our clinic, the patient reports doing very well.  Today, she denies symptoms of palpitations, chest pain, shortness of breath,  lower extremity edema, dizziness, presyncope, or syncope.  The patient is otherwise without complaint today.   Past Medical History  Diagnosis Date  . Hypertension   . Restless leg syndrome   . 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 History of recurrent vertebral fractures   . Right bundle branch block and left posterior fascicular block    Past Surgical History  Procedure Date  . Right lower leg vein stripping   . Sjm ppm 06/11    by JA for SSS and syncope    Current Outpatient Prescriptions  Medication Sig Dispense Refill  . acetaminophen (TYLENOL) 500 MG tablet Take 500 mg by mouth every 6 (six) hours as needed.      Marland Kitchen amLODipine (NORVASC) 10 MG tablet Take 10 mg by mouth daily.        Marland Kitchen aspirin 325 MG tablet Take 325 mg by mouth daily.      . citalopram (CELEXA) 40 MG tablet Take 40 mg by mouth daily.        . clonazePAM (KLONOPIN) 1 MG tablet Take 1 mg by mouth daily.        Marland Kitchen docusate sodium (COLACE) 50 MG capsule Take by mouth 2 (two) times daily.        . ferrous fumarate (HEMOCYTE - 106 MG FE) 325 (106 FE) MG TABS Take 1 tablet by mouth 2 (two) times daily.      . Melatonin 5 MG TABS Take 2 tablets by mouth at bedtime.       . Multiple Vitamin (MULTIVITAMIN) tablet Take 1 tablet by mouth daily.        Marland Kitchen oxybutynin (DITROPAN) 5 MG tablet Take 5 mg by mouth 2 (two) times daily.      . primidone (MYSOLINE) 50 MG tablet Take 75 mg by mouth daily.       Marland Kitchen terazosin (HYTRIN) 2 MG capsule Take 2 mg by mouth at bedtime.          Physical Exam: Filed Vitals:   07/14/11 1009  BP: 154/76  Pulse: 69  Resp: 18  Height: 5\' 1"  (1.549 m)  Weight: 125 lb (56.7 kg)   Repeat BP by me 134/76  GEN- The patient is well  appearing, alert and oriented x 3 today.   Head- normocephalic, atraumatic Eyes-  Sclera clear, conjunctiva pink Ears- hearing intact Oropharynx- clear Lungs- Clear to ausculation bilaterally, normal work of breathing Chest- pacemaker pocket is well healed Heart- Regular rate and rhythm, no murmurs, rubs or gallops, PMI not laterally displaced GI- soft, NT, ND, + BS Extremities- no clubbing, cyanosis, or edema  Pacemaker interrogation- reviewed in detail today,  See PACEART report  Assessment and Plan:

## 2011-07-14 NOTE — Assessment & Plan Note (Signed)
Elevated on arrival but improved She will follow her BP closely at home and let me know if it remains elevated No changes today

## 2011-07-14 NOTE — Patient Instructions (Addendum)
Your physician recommends that you schedule a follow-up appointment in: 1 year with Dr. Johney Frame. You will receive a reminder letter in the mail in about 10 months reminding you to call and schedule your appointment. If you don't receive this letter, please contact our office.  Continue phone device checks every 3 months.  Continue monitoring your blood pressures at home.

## 2011-08-31 IMAGING — CR DG CHEST 2V
2 series · 2 of 2 positions shown · non-contrast
Comparison: 07/22/2005

CLINICAL DATA: Post pacemaker placement

CHEST - 2 VIEW

[w chest pa]
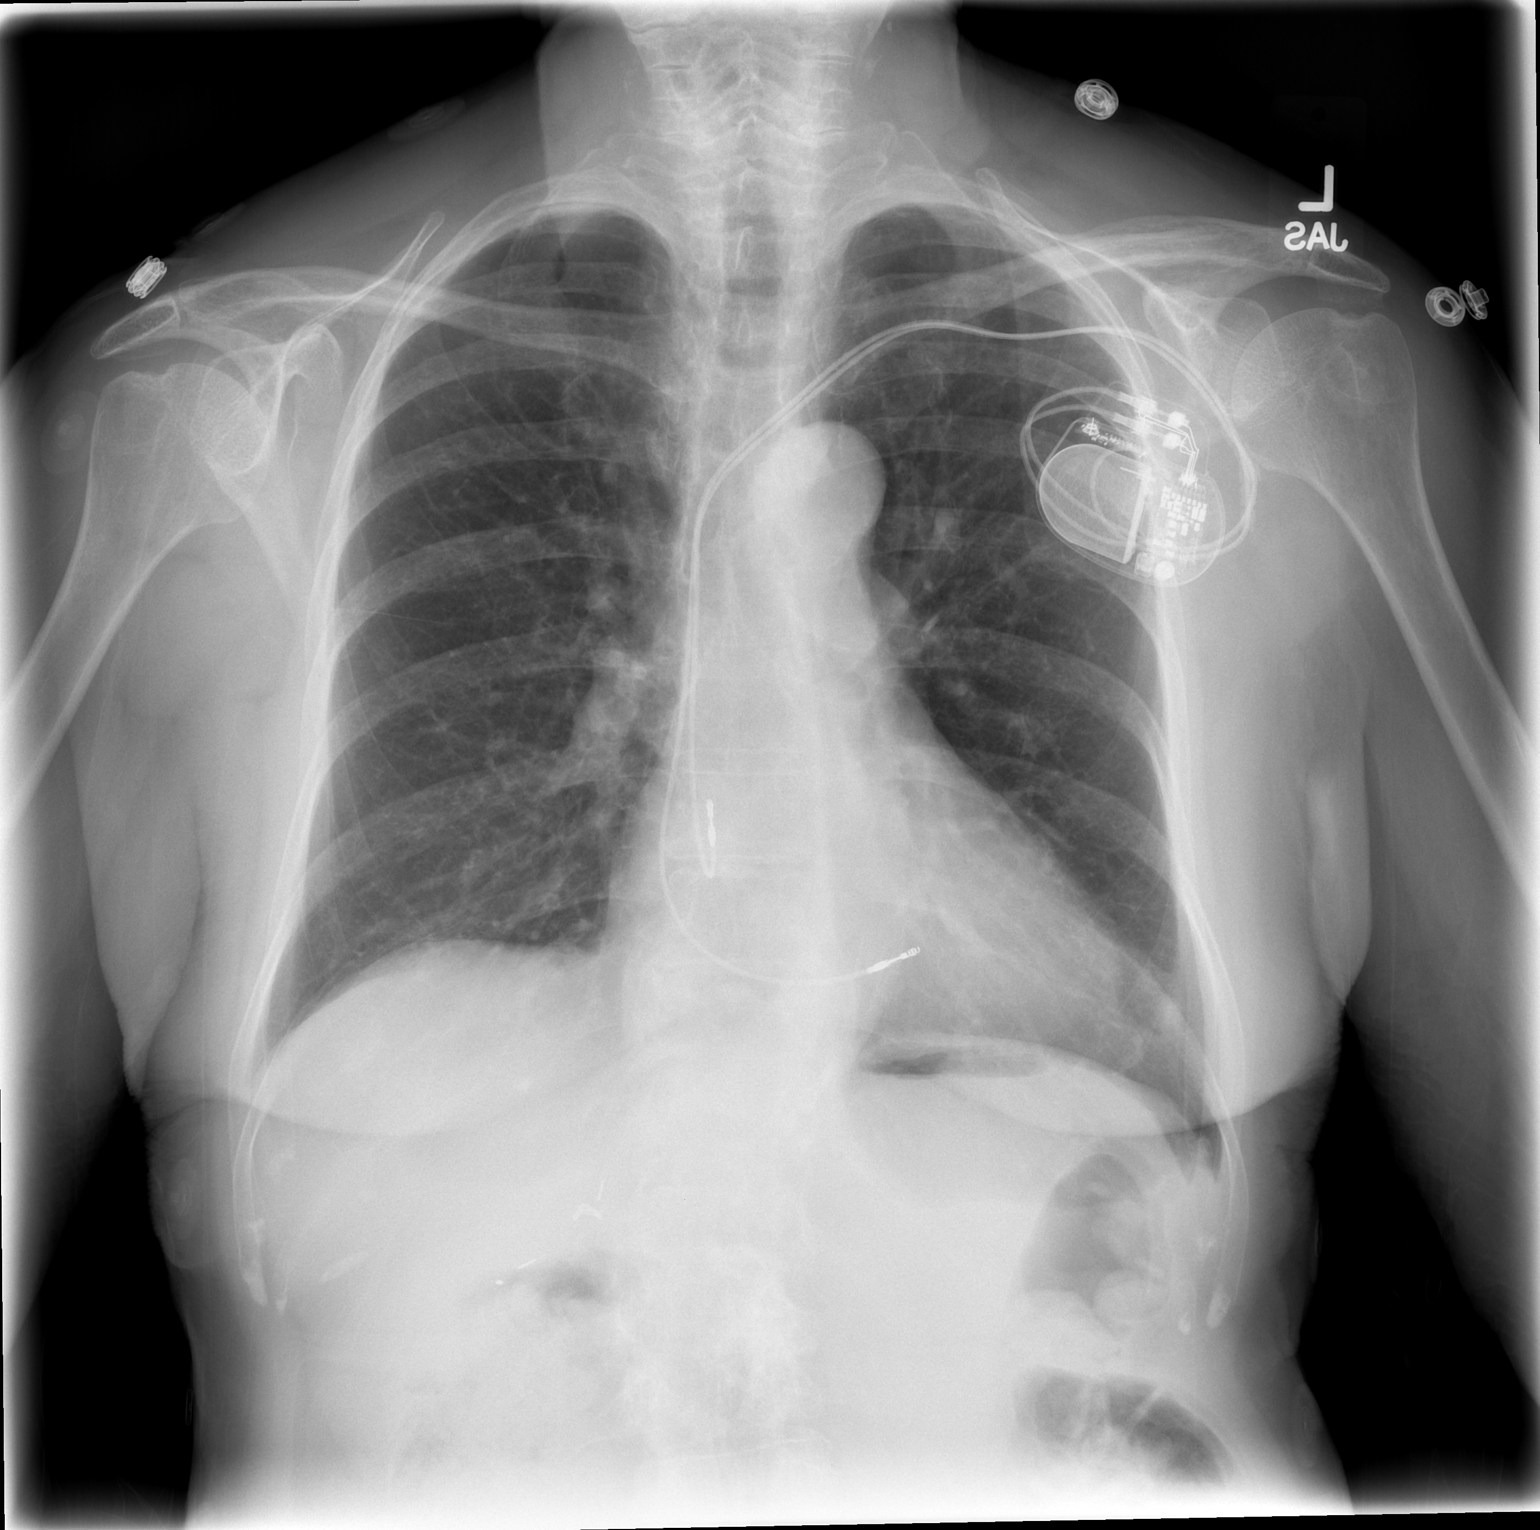

[w chest lat]
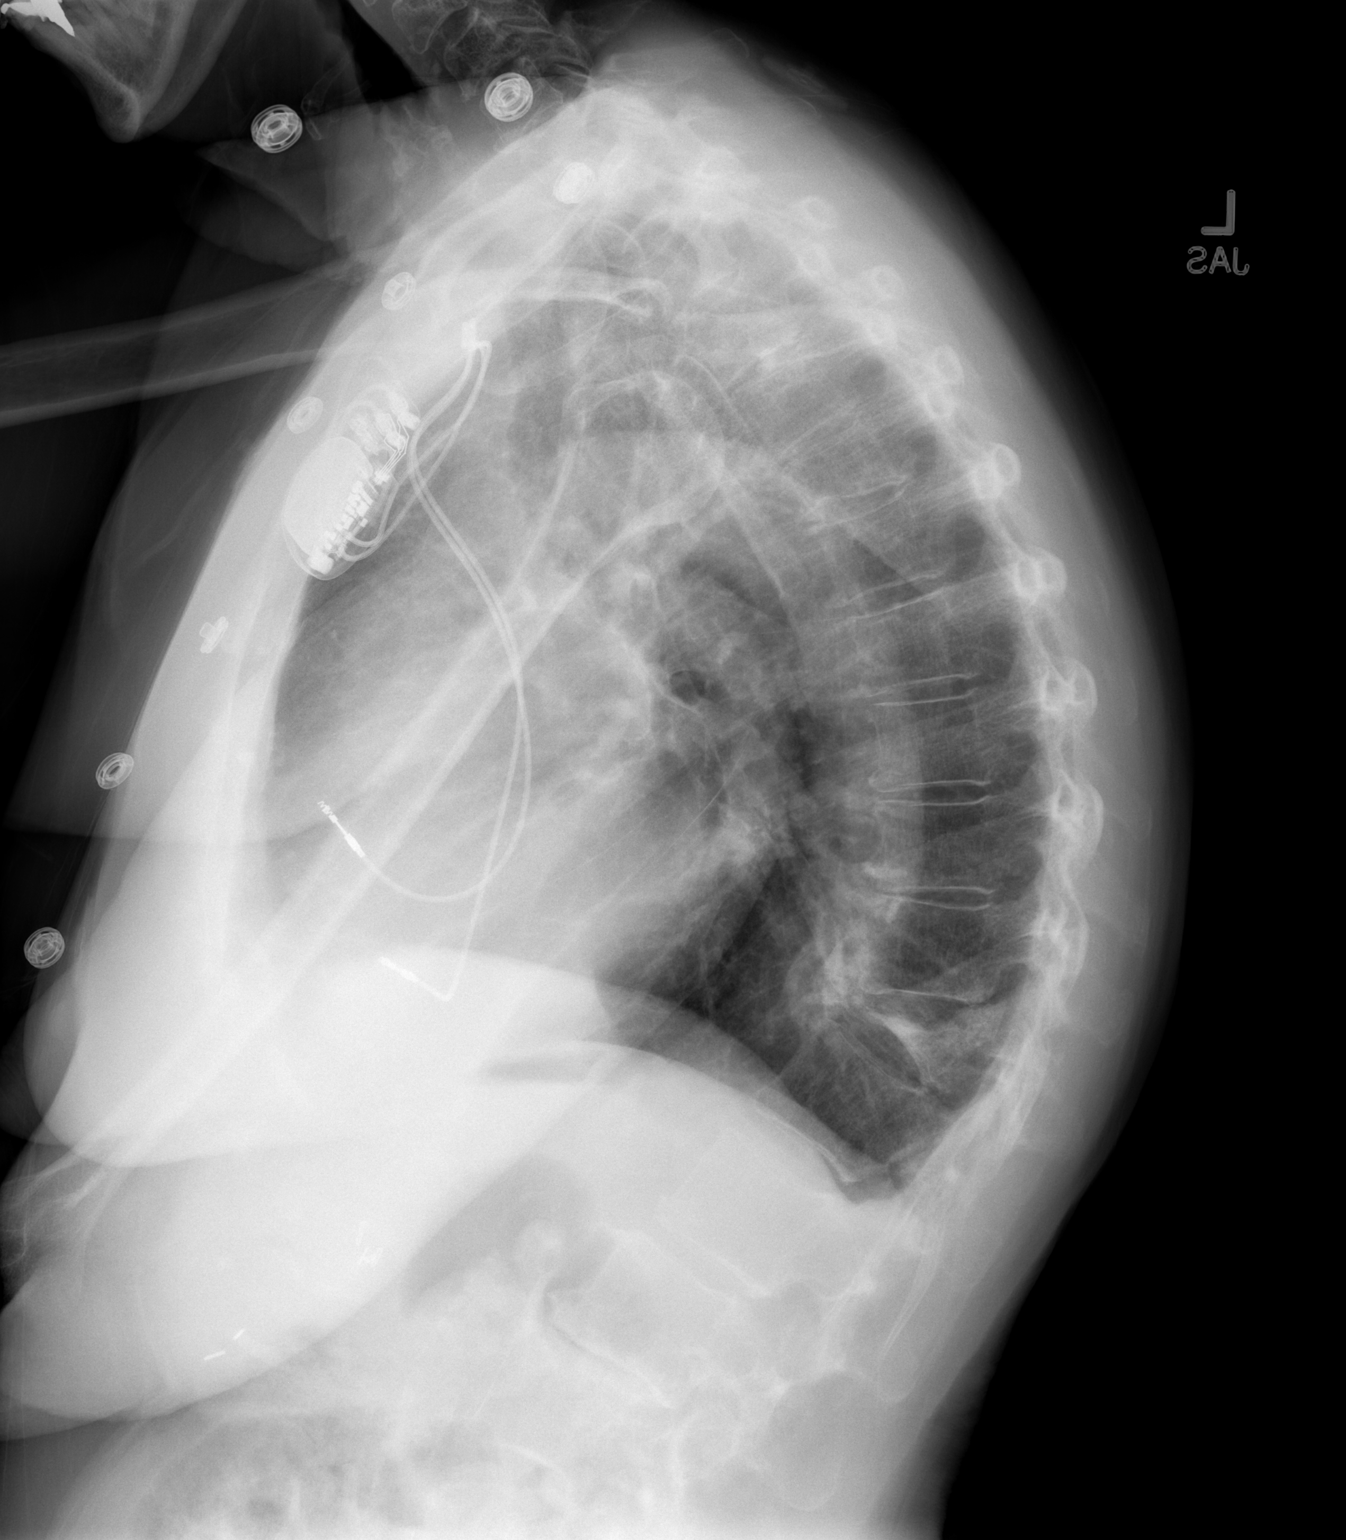

[2 of 2 positions shown; findings below may reference images not displayed]

FINDINGS: New dual lead left-sided pacer noted.  Leads terminate
over the right atrium and ventricle, respectively.  Lower thoracic
compression fracture is noted.  No lateral view is available for
comparison to assess for chronicity, although the appearance on the
frontal radiograph appears unchanged.  No pneumothorax.  No new
pulmonary opacity. Probable nipple shadow noted over the left lower
lobe.
IMPRESSION: No pneumothorax after left-sided pacer placement.

## 2011-10-17 ENCOUNTER — Telehealth: Payer: Self-pay | Admitting: *Deleted

## 2011-10-17 ENCOUNTER — Encounter: Payer: Medicare Other | Admitting: *Deleted

## 2011-10-17 ENCOUNTER — Encounter: Payer: Self-pay | Admitting: *Deleted

## 2011-10-17 NOTE — Telephone Encounter (Signed)
LMOM in regards to transmitter beeping. Instructed to unplug and plug back up. Also instructed to hit white button x 2. Pt was due for transmission today.

## 2011-10-17 NOTE — Telephone Encounter (Signed)
Patient called stating box on her bedside was beeping this morning.  Advised her that is was probably the device doing remote check, but will defer to [facility] (device tech) to give definite answer.  States she is feeling fine.  Please give patient a call back to let her know for sure.  Patient okay for you to leave message.

## 2011-11-21 ENCOUNTER — Telehealth: Payer: Self-pay | Admitting: Internal Medicine

## 2011-11-21 NOTE — Telephone Encounter (Signed)
Monitor continues to go off once or twice a week.  She wants this to be fixed.  She also needs to know how to tell when she should tell if it is the real thing or not.

## 2011-11-22 NOTE — Telephone Encounter (Signed)
Left message with cleaning lady for pt to return call/kwm

## 2011-11-23 NOTE — Telephone Encounter (Signed)
Number to [facility] tech services was given to pt. Pt reset transmitter a couple of days ago and still having problems with beeping. Pt instructed to call [facility] to see what is going on.

## 2011-11-23 NOTE — Telephone Encounter (Signed)
Pt rtn call to kristen, pls call [phone]

## 2011-11-23 NOTE — Telephone Encounter (Signed)
F/u   Pt f/u regarding device beeping.

## 2012-02-10 ENCOUNTER — Encounter: Payer: Self-pay | Admitting: *Deleted

## 2012-06-12 ENCOUNTER — Telehealth: Payer: Self-pay | Admitting: Internal Medicine

## 2012-06-12 NOTE — Telephone Encounter (Signed)
New problem   Pt has a question about her device going off over the weekend. Please call pt.

## 2012-07-11 ENCOUNTER — Encounter: Payer: Self-pay | Admitting: Internal Medicine

## 2012-07-11 ENCOUNTER — Ambulatory Visit (INDEPENDENT_AMBULATORY_CARE_PROVIDER_SITE_OTHER): Payer: Medicare Other | Admitting: Internal Medicine

## 2012-07-11 VITALS — BP 145/75 | HR 63 | Ht 61.0 in | Wt 123.0 lb

## 2012-07-11 DIAGNOSIS — I495 Sick sinus syndrome: Secondary | ICD-10-CM

## 2012-07-11 DIAGNOSIS — I10 Essential (primary) hypertension: Secondary | ICD-10-CM

## 2012-07-11 LAB — PACEMAKER DEVICE OBSERVATION
AL IMPEDENCE PM: 400 Ohm
ATRIAL PACING PM: 69
BATTERY VOLTAGE: 2.9779 V
RV LEAD IMPEDENCE PM: 387.5 Ohm
VENTRICULAR PACING PM: 1

## 2012-07-11 NOTE — Progress Notes (Signed)
PCP: Romeo Rabon, MD  Amber [name] is a 79 y.o. female who presents today for routine electrophysiology followup.  Since last being seen in our clinic, the patient reports doing very well.  Today, she denies symptoms of palpitations, chest pain, shortness of breath,  lower extremity edema, dizziness, presyncope, or syncope.  The patient is otherwise without complaint today.   Past Medical History  Diagnosis Date  . Hypertension   . Restless leg syndrome   . 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 History of recurrent vertebral fractures   . Right bundle branch block and left posterior fascicular block    Past Surgical History  Procedure Laterality Date  . Right lower leg vein stripping    . Sjm ppm  06/11    by JA for SSS and syncope    Current Outpatient Prescriptions  Medication Sig Dispense Refill  . acetaminophen (TYLENOL) 500 MG tablet Take 500 mg by mouth every 6 (six) hours as needed.      Marland Kitchen amLODipine (NORVASC) 10 MG tablet Take 10 mg by mouth daily.        Marland Kitchen aspirin 325 MG tablet Take 325 mg by mouth daily.      . citalopram (CELEXA) 40 MG tablet Take 40 mg by mouth daily.        . clonazePAM (KLONOPIN) 1 MG tablet Take 1 mg by mouth daily.        Marland Kitchen docusate sodium (COLACE) 50 MG capsule Take by mouth 2 (two) times daily.        . ferrous fumarate (HEMOCYTE - 106 MG FE) 325 (106 FE) MG TABS Take 1 tablet by mouth 2 (two) times daily.      . Melatonin 5 MG TABS Take 2 tablets by mouth at bedtime.       . Multiple Vitamin (MULTIVITAMIN) tablet Take 1 tablet by mouth daily.        . primidone (MYSOLINE) 50 MG tablet Take 75 mg by mouth daily.       Marland Kitchen terazosin (HYTRIN) 2 MG capsule Take 2 mg by mouth at bedtime.         No current facility-administered medications for this visit.    Physical Exam: Filed Vitals:   07/11/12 1116  BP: 145/75  Pulse: 63  Height: 5\' 1"  (1.549 m)  Weight: 123 lb (55.792 kg)   Repeat BP by me 134/76  GEN- The patient is well appearing,  alert and oriented x 3 today.   Head- normocephalic, atraumatic Eyes-  Sclera clear, conjunctiva pink Ears- hearing intact Oropharynx- clear Lungs- Clear to ausculation bilaterally, normal work of breathing Chest- pacemaker pocket is well healed Heart- Regular rate and rhythm, no murmurs, rubs or gallops, PMI not laterally displaced GI- soft, NT, ND, + BS Extremities- no clubbing, cyanosis, or edema  Pacemaker interrogation- reviewed in detail today,  See PACEART report  Assessment and Plan:  1. Symptomatic Bradycardia Normal pacemaker function See Pace Art report No changes today  2. HTN Stable No change required today  Return in 1 year Merlin checks

## 2012-07-11 NOTE — Patient Instructions (Addendum)

## 2012-10-15 ENCOUNTER — Encounter: Payer: Medicare Other | Admitting: *Deleted

## 2012-10-16 ENCOUNTER — Encounter: Payer: Self-pay | Admitting: *Deleted

## 2012-10-22 ENCOUNTER — Telehealth: Payer: Self-pay | Admitting: Internal Medicine

## 2012-10-22 NOTE — Telephone Encounter (Signed)
Patient called asking why she got a letter about missing a transmission. She said she has gotten a box but has never been told how or what she needs to do with it.  She stated that she is never home ( visits her husband during the day) and to please write her a letter explaining what she should be doing.

## 2012-10-23 NOTE — Telephone Encounter (Signed)
Spoke w/pt and instructed to press white button x 2. Pt was also given 800 number in case of a problem. Pt to send transmission now and will let pt know if was received.

## 2013-02-22 ENCOUNTER — Telehealth: Payer: Self-pay | Admitting: Internal Medicine

## 2013-02-22 NOTE — Telephone Encounter (Signed)
Spoke w/son in regards to patient falling. Pt was taken to PCP for treatment.

## 2013-02-22 NOTE — Telephone Encounter (Signed)
New Problem:  Pt's son states his mom has fallen multiple in the past few days and broke some of her teeth. The pt is worried she has damaged her pacemaker. Pt would liked to be worked in to have her pacemaker check out ASAP. Pt or her son would like a call back ASAP.  Pt is an allred pt and I informed the pt the soonest thing available with  Allred or [facility] was on 2/17.Marland Kitchen. Pt wants to be seen much sooner than that.

## 2013-02-22 NOTE — Telephone Encounter (Signed)
DISCUSSED WITH DEVICE  CLINIC K[facility]   TO CALL  AND   ADDRESS MESSAGE./CY

## 2013-05-07 NOTE — Telephone Encounter (Signed)
error 

## 2013-07-08 ENCOUNTER — Ambulatory Visit (INDEPENDENT_AMBULATORY_CARE_PROVIDER_SITE_OTHER): Payer: Medicare Other | Admitting: Internal Medicine

## 2013-07-08 ENCOUNTER — Encounter: Payer: Self-pay | Admitting: Internal Medicine

## 2013-07-08 VITALS — BP 152/78 | HR 60 | Ht 61.0 in | Wt 130.0 lb

## 2013-07-08 DIAGNOSIS — I495 Sick sinus syndrome: Secondary | ICD-10-CM

## 2013-07-08 DIAGNOSIS — I10 Essential (primary) hypertension: Secondary | ICD-10-CM

## 2013-07-08 LAB — MDC_IDC_ENUM_SESS_TYPE_INCLINIC
Battery Voltage: 2.96 V
Brady Statistic RA Percent Paced: 81 %
Date Time Interrogation Session: 20150622115621
Implantable Pulse Generator Model: 2210
Implantable Pulse Generator Serial Number: 7136330
Lead Channel Impedance Value: 387.5 Ohm
Lead Channel Pacing Threshold Amplitude: 1 V
Lead Channel Pacing Threshold Pulse Width: 0.5 ms
Lead Channel Pacing Threshold Pulse Width: 0.5 ms
Lead Channel Sensing Intrinsic Amplitude: 7.2 mV
Lead Channel Setting Pacing Amplitude: 2.5 V
MDC IDC MSMT BATTERY REMAINING LONGEVITY: 110.4 mo
MDC IDC MSMT LEADCHNL RA IMPEDANCE VALUE: 350 Ohm
MDC IDC MSMT LEADCHNL RA PACING THRESHOLD AMPLITUDE: 1 V
MDC IDC MSMT LEADCHNL RA PACING THRESHOLD PULSEWIDTH: 0.5 ms
MDC IDC MSMT LEADCHNL RV PACING THRESHOLD AMPLITUDE: 1 V
MDC IDC MSMT LEADCHNL RV PACING THRESHOLD AMPLITUDE: 1 V
MDC IDC MSMT LEADCHNL RV PACING THRESHOLD PULSEWIDTH: 0.5 ms
MDC IDC SET LEADCHNL RA PACING AMPLITUDE: 2 V
MDC IDC SET LEADCHNL RV PACING PULSEWIDTH: 0.5 ms
MDC IDC SET LEADCHNL RV SENSING SENSITIVITY: 2 mV
MDC IDC STAT BRADY RV PERCENT PACED: 7 %

## 2013-07-08 NOTE — Patient Instructions (Signed)
   Merlin/Home 10/09/13. Your physician recommends that you schedule a follow-up appointment in: 1 year with Dr. Johney FrameAllred. You will receive a reminder letter in the mail in about 10 months reminding you to call and schedule your appointment. If you don't receive this letter, please contact our office. Your physician recommends that you continue on your current medications as directed. Please refer to the Current Medication list given to you today.

## 2013-07-08 NOTE — Progress Notes (Signed)
PCP: Romeo RabonAPLAN,MICHAEL, MD  Amber [name] is a 80 y.o. female who presents today for routine electrophysiology followup.  Since last being seen in our clinic, the patient reports doing very well.  Today, she denies symptoms of palpitations, exertional chest pain, shortness of breath,  lower extremity edema, dizziness, presyncope, or syncope.  She had a single episode of sharp, fleeting and atypical chest pains several nights ago but denies any other episodes.  She will let me know if this returns.  The patient is otherwise without complaint today.   Past Medical History  Diagnosis Date  . Hypertension   . Restless leg syndrome   . 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 History of recurrent vertebral fractures   . Right bundle branch block and left posterior fascicular block    Past Surgical History  Procedure Laterality Date  . Right lower leg vein stripping    . Sjm ppm  06/11    by JA for SSS and syncope    Current Outpatient Prescriptions  Medication Sig Dispense Refill  . acetaminophen (TYLENOL) 500 MG tablet Take 500 mg by mouth every 6 (six) hours as needed.      Marland Kitchen. amLODipine (NORVASC) 10 MG tablet Take 10 mg by mouth daily.        Marland Kitchen. aspirin 81 MG tablet Take 81 mg by mouth as needed for pain.      . citalopram (CELEXA) 40 MG tablet Take 40 mg by mouth daily.        . clonazePAM (KLONOPIN) 1 MG tablet Take 1 mg by mouth daily.        . ferrous fumarate (HEMOCYTE - 106 MG FE) 325 (106 FE) MG TABS Take 1 tablet by mouth 2 (two) times daily.      . Melatonin 5 MG TABS Take 2 tablets by mouth at bedtime.       . Multiple Vitamin (MULTIVITAMIN) tablet Take 1 tablet by mouth daily.        . primidone (MYSOLINE) 50 MG tablet Take 75 mg by mouth daily.       Marland Kitchen. terazosin (HYTRIN) 2 MG capsule Take 2 mg by mouth at bedtime.         No current facility-administered medications for this visit.    Physical Exam: Filed Vitals:   07/08/13 1113  BP: 152/78  Pulse: 60  Height: 5\' 1"  (1.549 m)   Weight: 130 lb (58.968 kg)   Repeat BP by me 134/76  GEN- The patient is well appearing, alert and oriented x 3 today.   Head- normocephalic, atraumatic Eyes-  Sclera clear, conjunctiva pink Ears- hearing intact Oropharynx- clear Lungs- Clear to ausculation bilaterally, normal work of breathing Chest- pacemaker pocket is well healed Heart- Regular rate and rhythm, no murmurs, rubs or gallops, PMI not laterally displaced GI- soft, NT, ND, + BS Extremities- no clubbing, cyanosis, or edema  Pacemaker interrogation- reviewed in detail today,  See PACEART report  Assessment and Plan:  1. Symptomatic Bradycardia Normal pacemaker function See Pace Art report No changes today Noise noted on her atrial lead, however lead function is normal Will follow  2. HTN Stable No change required today  3. Atypical chest pain Likely musculoskeletal She will let me know if this returns  4. Urinary incontinence At night I have encouraged her to follow-up with Urology  Return in 1 year Merlin checks

## 2013-10-08 ENCOUNTER — Telehealth: Payer: Self-pay | Admitting: Cardiology

## 2013-10-08 NOTE — Telephone Encounter (Signed)
I received a message that Amber [name] was requesting a call. I called Amber [name] and informed her how to send manual transmission that she is due to send one tomorrow 9-23. I also informed her that she will see Dr. Johney Frame in office once a year in the [place] office. She verbalized understanding.

## 2013-10-09 ENCOUNTER — Encounter: Payer: Medicare Other | Admitting: *Deleted

## 2013-10-10 ENCOUNTER — Encounter: Payer: Self-pay | Admitting: Cardiology

## 2013-10-18 ENCOUNTER — Telehealth: Payer: Self-pay | Admitting: Cardiology

## 2013-10-18 NOTE — Telephone Encounter (Signed)
Pt called in regards to a letter she received stating that we did not receive remote trasnmission on 10-09-2013. Pt asked that i inform [facility] that i was handling call and then hung up on me before I could explain letter to her. Attempted to call pt back and LMOVM for pt to return call on Monday after 8:30 AM.

## 2013-11-26 ENCOUNTER — Encounter: Payer: Self-pay | Admitting: Cardiology

## 2014-01-02 ENCOUNTER — Telehealth: Payer: Self-pay | Admitting: Cardiology

## 2014-01-02 NOTE — Telephone Encounter (Signed)
Pt returned a letter stated that she seen Dr. Johney FrameAllred in the [place] office. Called pt to talk w/ her about home monitor. Pt stated that she does not want to use the home monitor and she wants to come to office every six months to have pacemaker interrogated. Pt agreed to appt on 01-24-13 at 3:30 w/ device clinic in the [place] office.

## 2014-01-24 ENCOUNTER — Encounter: Payer: Self-pay | Admitting: Internal Medicine

## 2014-01-24 ENCOUNTER — Ambulatory Visit (INDEPENDENT_AMBULATORY_CARE_PROVIDER_SITE_OTHER): Payer: Medicare Other | Admitting: *Deleted

## 2014-01-24 DIAGNOSIS — I495 Sick sinus syndrome: Secondary | ICD-10-CM | POA: Diagnosis not present

## 2014-01-24 LAB — MDC_IDC_ENUM_SESS_TYPE_INCLINIC
Battery Remaining Longevity: 110.4 mo
Battery Voltage: 2.96 V
Implantable Pulse Generator Serial Number: 7136330
Lead Channel Impedance Value: 325 Ohm
Lead Channel Impedance Value: 362.5 Ohm
Lead Channel Pacing Threshold Amplitude: 0.75 V
Lead Channel Pacing Threshold Pulse Width: 0.5 ms
Lead Channel Sensing Intrinsic Amplitude: 3.3 mV
Lead Channel Setting Pacing Amplitude: 2.5 V
MDC IDC MSMT LEADCHNL RV PACING THRESHOLD AMPLITUDE: 0.75 V
MDC IDC MSMT LEADCHNL RV PACING THRESHOLD PULSEWIDTH: 0.5 ms
MDC IDC MSMT LEADCHNL RV SENSING INTR AMPL: 6.4 mV
MDC IDC SESS DTM: 20160108155210
MDC IDC SET LEADCHNL RA PACING AMPLITUDE: 2 V
MDC IDC SET LEADCHNL RV PACING PULSEWIDTH: 0.5 ms
MDC IDC SET LEADCHNL RV SENSING SENSITIVITY: 2 mV
MDC IDC STAT BRADY RA PERCENT PACED: 71 %
MDC IDC STAT BRADY RV PERCENT PACED: 9.1 %

## 2014-01-24 NOTE — Progress Notes (Signed)
Pacemaker check in clinic. Normal device function. Battery longevity 7.8 to 9.2 years. Pt in AF 2.5%. Longest episode was 1 hour 12 minutes. + ASA 81 mg only. No changes made. ROV in 6 mths w/JA in [place].  Pt has monitor but at this time prefers ov.

## 2014-02-24 ENCOUNTER — Telehealth: Payer: Self-pay | Admitting: *Deleted

## 2014-02-24 NOTE — Telephone Encounter (Signed)
Patient states she heard twirping (beeping) noise last evening.  Stated she heard this 3 different times, but could not find anything in her house that may have made that noise.  She questions if this could have been her devcice.  Informed patient that per last check her battery life was 7-9 years.  Will send message to device clinic for advice to make sure.  Patient asked for a call back before 1:00 if possible as she has to speak at her [employer] at 2 today.

## 2014-02-25 NOTE — Telephone Encounter (Signed)
LMTCB/SSS 

## 2014-02-25 NOTE — Telephone Encounter (Signed)
Mrs. Amber [name] returned your call. She requested that you let the phone ring until she answers.

## 2014-02-25 NOTE — Telephone Encounter (Signed)
Spoke to pt about the beeping she heard on the 7th. I asked patient to send a remote to rule out her ppm, but she stated that she has had no success with the transmitter. I offered her an appt for the device clinic in [place] for today, but she stated that she is unable to come to this location since she lives in [place]. I offered her an appt in the [place] office on the 2/12, but again she declined the appt. She finally accepted an appt in the [place] office for later this month.

## 2014-03-03 ENCOUNTER — Telehealth: Payer: Self-pay | Admitting: Internal Medicine

## 2014-03-03 NOTE — Telephone Encounter (Signed)
Patient called stating that she received a call Sunday morning at 5am telling her to contact asap about her monitor. Patient wanted [facility] number to call herself also.

## 2014-03-03 NOTE — Telephone Encounter (Signed)
LMOVM for pt to return call 

## 2014-03-03 NOTE — Telephone Encounter (Signed)
Follow up     Pt said she received a call Sunday am around 5 telling her to contact our office ASAP regarding her monitor.  Please call

## 2014-03-04 NOTE — Telephone Encounter (Signed)
I informed pt that she has an appt in [place] office on 2-26 to see device tech. At that time they will try and figure out what the noise is she heard. Pt verbalized understanding.

## 2014-07-09 ENCOUNTER — Encounter: Payer: Self-pay | Admitting: *Deleted

## 2014-07-18 ENCOUNTER — Encounter: Payer: Medicare Other | Admitting: Internal Medicine

## 2014-09-19 ENCOUNTER — Encounter: Payer: Self-pay | Admitting: Internal Medicine

## 2014-09-19 ENCOUNTER — Ambulatory Visit (INDEPENDENT_AMBULATORY_CARE_PROVIDER_SITE_OTHER): Payer: Medicare Other | Admitting: Internal Medicine

## 2014-09-19 VITALS — BP 125/77 | HR 61 | Ht 61.0 in | Wt 135.0 lb

## 2014-09-19 DIAGNOSIS — I495 Sick sinus syndrome: Secondary | ICD-10-CM | POA: Diagnosis not present

## 2014-09-19 DIAGNOSIS — I10 Essential (primary) hypertension: Secondary | ICD-10-CM

## 2014-09-19 NOTE — Progress Notes (Signed)
PCP: Romeo Rabon, MD  ARIENNA [name] is a 81 y.o. female who presents today for routine electrophysiology followup.  Since last being seen in our clinic, the patient reports doing very well.  She still drives and is amazingly active for 89.  She swims at the [facility].  Today, she denies symptoms of palpitations, exertional chest pain, shortness of breath,  lower extremity edema, dizziness, presyncope, or syncope.  Her primary concern is with poor hearing.  The patient is otherwise without complaint today.   Past Medical History  Diagnosis Date  . Hypertension   . Restless leg syndrome   . 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 History of recurrent vertebral fractures   . Right bundle branch block and left posterior fascicular block    Past Surgical History  Procedure Laterality Date  . Right lower leg vein stripping    . Sjm ppm  06/11    by JA for SSS and syncope    Current Outpatient Prescriptions  Medication Sig Dispense Refill  . acetaminophen (TYLENOL) 500 MG tablet Take 500 mg by mouth every 6 (six) hours as needed.    Marland Kitchen amLODipine (NORVASC) 10 MG tablet Take 10 mg by mouth daily.      Marland Kitchen aspirin 81 MG tablet Take 81 mg by mouth as needed for pain.    . citalopram (CELEXA) 40 MG tablet Take 40 mg by mouth daily.      . clonazePAM (KLONOPIN) 1 MG tablet Take 1 mg by mouth daily.      . ferrous fumarate (HEMOCYTE - 106 MG FE) 325 (106 FE) MG TABS Take 1 tablet by mouth 2 (two) times daily.    . metoprolol (LOPRESSOR) 50 MG tablet Take 50 mg by mouth 2 (two) times daily.    . Multiple Vitamin (MULTIVITAMIN) tablet Take 1 tablet by mouth daily.      . primidone (MYSOLINE) 50 MG tablet Take 75 mg by mouth daily.     Marland Kitchen terazosin (HYTRIN) 2 MG capsule Take 2 mg by mouth at bedtime.       No current facility-administered medications for this visit.    Physical Exam: Filed Vitals:   09/19/14 1111  BP: 125/77  Pulse: 61  Height:  (1.549 m)  Weight: 61.236 kg (135 lb)  SpO2: 97%    Repeat BP by me 134/76  GEN- The patient is well appearing, alert and oriented x 3 today.   Head- normocephalic, atraumatic Eyes-  Sclera clear, conjunctiva pink Ears- hearing intact Oropharynx- clear Lungs- Clear to ausculation bilaterally, normal work of breathing Chest- pacemaker pocket is well healed Heart- Regular rate and rhythm, no murmurs, rubs or gallops, PMI not laterally displaced GI- soft, NT, ND, + BS Extremities- no clubbing, cyanosis, or edema  Pacemaker interrogation- reviewed in detail today,  See PACEART report  Assessment and Plan:  1. Symptomatic Bradycardia Normal pacemaker function See Pace Art report No changes today Noise noted on her atrial lead which is reproducible with pocket manipulation, however lead function is normal Will follow  2. HTN Stable No change required today  3. Atypical chest pain resolved  Return in 1 year Merlin checks

## 2014-09-19 NOTE — Patient Instructions (Signed)
Your physician recommends that you continue on your current medications as directed. Please refer to the Current Medication list given to you today. Device check 12/22/14. Your physician recommends that you schedule a follow-up appointment in:1 year with Dr. Allred. You will receive a reminder letter in the mail in about 10 months reminding you to call and schedule your appointment. If you don't receive this letter, please contact our office. 

## 2014-09-23 LAB — CUP PACEART INCLINIC DEVICE CHECK
Battery Remaining Longevity: 99.6 mo
Date Time Interrogation Session: 20160902154741
Lead Channel Impedance Value: 337.5 Ohm
Lead Channel Impedance Value: 400 Ohm
Lead Channel Pacing Threshold Amplitude: 0.75 V
Lead Channel Pacing Threshold Pulse Width: 0.5 ms
Lead Channel Pacing Threshold Pulse Width: 0.5 ms
Lead Channel Pacing Threshold Pulse Width: 0.5 ms
Lead Channel Setting Pacing Amplitude: 2 V
Lead Channel Setting Pacing Pulse Width: 0.5 ms
Lead Channel Setting Sensing Sensitivity: 2 mV
MDC IDC MSMT BATTERY VOLTAGE: 2.95 V
MDC IDC MSMT LEADCHNL RA PACING THRESHOLD AMPLITUDE: 0.75 V
MDC IDC MSMT LEADCHNL RA PACING THRESHOLD PULSEWIDTH: 0.5 ms
MDC IDC MSMT LEADCHNL RV PACING THRESHOLD AMPLITUDE: 1 V
MDC IDC MSMT LEADCHNL RV PACING THRESHOLD AMPLITUDE: 1 V
MDC IDC MSMT LEADCHNL RV SENSING INTR AMPL: 6.7 mV
MDC IDC SET LEADCHNL RV PACING AMPLITUDE: 2.5 V
MDC IDC STAT BRADY RA PERCENT PACED: 86 %
MDC IDC STAT BRADY RV PERCENT PACED: 11 %
Pulse Gen Serial Number: 7136330

## 2014-12-22 ENCOUNTER — Encounter: Payer: Medicare Other | Admitting: *Deleted

## 2014-12-22 ENCOUNTER — Telehealth: Payer: Self-pay | Admitting: Cardiology

## 2014-12-22 NOTE — Telephone Encounter (Signed)
Spoke with pt and reminded pt of remote transmission that is due today. Pt verbalized understanding.   

## 2014-12-23 ENCOUNTER — Encounter: Payer: Self-pay | Admitting: Cardiology

## 2015-04-03 ENCOUNTER — Encounter (INDEPENDENT_AMBULATORY_CARE_PROVIDER_SITE_OTHER): Payer: Self-pay | Admitting: *Deleted

## 2015-04-22 ENCOUNTER — Ambulatory Visit (INDEPENDENT_AMBULATORY_CARE_PROVIDER_SITE_OTHER): Payer: Medicare Other | Admitting: Internal Medicine

## 2015-04-24 ENCOUNTER — Encounter: Payer: Self-pay | Admitting: Internal Medicine

## 2015-04-24 ENCOUNTER — Ambulatory Visit (INDEPENDENT_AMBULATORY_CARE_PROVIDER_SITE_OTHER): Payer: Medicare Other | Admitting: *Deleted

## 2015-04-24 DIAGNOSIS — I495 Sick sinus syndrome: Secondary | ICD-10-CM

## 2015-04-24 LAB — CUP PACEART INCLINIC DEVICE CHECK
Battery Voltage: 2.95 V
Brady Statistic RA Percent Paced: 95 %
Implantable Lead Implant Date: 20110603
Implantable Lead Implant Date: 20110603
Implantable Lead Location: 753860
Lead Channel Impedance Value: 362.5 Ohm
Lead Channel Pacing Threshold Amplitude: 0.75 V
Lead Channel Pacing Threshold Amplitude: 0.75 V
Lead Channel Pacing Threshold Amplitude: 0.75 V
Lead Channel Pacing Threshold Amplitude: 0.75 V
Lead Channel Pacing Threshold Pulse Width: 0.5 ms
Lead Channel Pacing Threshold Pulse Width: 0.5 ms
Lead Channel Pacing Threshold Pulse Width: 0.5 ms
Lead Channel Setting Pacing Amplitude: 2 V
Lead Channel Setting Pacing Amplitude: 2.5 V
MDC IDC LEAD LOCATION: 753859
MDC IDC MSMT BATTERY REMAINING LONGEVITY: 99.6
MDC IDC MSMT LEADCHNL RV IMPEDANCE VALUE: 412.5 Ohm
MDC IDC MSMT LEADCHNL RV PACING THRESHOLD PULSEWIDTH: 0.5 ms
MDC IDC MSMT LEADCHNL RV SENSING INTR AMPL: 7.8 mV
MDC IDC SESS DTM: 20170407151719
MDC IDC SET LEADCHNL RV PACING PULSEWIDTH: 0.5 ms
MDC IDC SET LEADCHNL RV SENSING SENSITIVITY: 2 mV
MDC IDC STAT BRADY RV PERCENT PACED: 11 %
Pulse Gen Serial Number: 7136330

## 2015-04-24 NOTE — Progress Notes (Signed)
Pacemaker check in clinic. Normal device function. Thresholds, sensing, impedances consistent with previous measurements. Device programmed to maximize longevity. 18,052 mode switches- EGMs all show A lead noise (639 noise reversions)- known. Longest AMS- 6 hrs, 19 mins with an atrial rate of 640bpm, no EGM. No high ventricular rates noted. Device programmed at appropriate safety margins. Histogram distribution appropriate for patient activity level. Device programmed to optimize intrinsic conduction. Estimated longevity 7.6-8.3 years. Patient enrolled in remote follow-up but declines. ROV with JA 09/18/15 at 10:45am.

## 2015-09-16 ENCOUNTER — Ambulatory Visit (INDEPENDENT_AMBULATORY_CARE_PROVIDER_SITE_OTHER): Payer: Medicare Other | Admitting: Internal Medicine

## 2015-09-16 ENCOUNTER — Encounter: Payer: Self-pay | Admitting: Internal Medicine

## 2015-09-16 VITALS — BP 110/64 | HR 65 | Ht 61.0 in | Wt 140.0 lb

## 2015-09-16 DIAGNOSIS — I10 Essential (primary) hypertension: Secondary | ICD-10-CM

## 2015-09-16 DIAGNOSIS — I495 Sick sinus syndrome: Secondary | ICD-10-CM | POA: Diagnosis not present

## 2015-09-16 NOTE — Progress Notes (Signed)
PCP: Romeo RabonAPLAN,MICHAEL, MD  Amber [name] S Rede is a 82 y.o. female who presents today for routine electrophysiology followup.  Since last being seen in our clinic, the patient reports doing very well.  She still drives.  Her primary concern today is with chronic back pain.  Today, she denies symptoms of palpitations, exertional chest pain, shortness of breath,  lower extremity edema, dizziness, presyncope, or syncope.  Her primary concern is with poor hearing.  The patient is otherwise without complaint today.   Past Medical History:  Diagnosis Date  . Bradycardia    S/p SJM PPM 6/11  . History of recurrent vertebral fractures   . Hypertension   . Restless leg syndrome   . Right bundle branch block and left posterior fascicular block    Past Surgical History:  Procedure Laterality Date  . RIGHT LOWER LEG VEIN STRIPPING    . SJM PPM  06/11   by Fawn KirkJA for SSS and syncope    Current Outpatient Prescriptions  Medication Sig Dispense Refill  . acetaminophen (TYLENOL) 500 MG tablet Take 500 mg by mouth every 6 (six) hours as needed.    Marland Kitchen. amLODipine (NORVASC) 10 MG tablet Take 10 mg by mouth daily.      Marland Kitchen. aspirin 81 MG tablet Take 81 mg by mouth as needed for pain. Reported on 04/24/2015    . citalopram (CELEXA) 40 MG tablet Take 40 mg by mouth daily.      . clonazePAM (KLONOPIN) 1 MG tablet Take 1 mg by mouth daily.      . ferrous fumarate (HEMOCYTE - 106 MG FE) 325 (106 FE) MG TABS Take 1 tablet by mouth 2 (two) times daily.    . metoprolol (LOPRESSOR) 50 MG tablet Take 50 mg by mouth 2 (two) times daily.    . Multiple Vitamin (MULTIVITAMIN) tablet Take 1 tablet by mouth daily.      . primidone (MYSOLINE) 50 MG tablet Take 75 mg by mouth daily.     Marland Kitchen. terazosin (HYTRIN) 2 MG capsule Take 2 mg by mouth at bedtime.       No current facility-administered medications for this visit.     Physical Exam: Vitals:   09/16/15 0918  BP: 110/64  Pulse: 65  SpO2: 96%  Weight: 140 lb (63.5 kg)  Height: 5'  1" (1.549 m)   Repeat BP by me 134/76  GEN- The patient is well appearing, alert and oriented x 3 today.   Head- normocephalic, atraumatic Eyes-  Sclera clear, conjunctiva pink Ears- hearing intact Oropharynx- clear Lungs- Clear to ausculation bilaterally, normal work of breathing Chest- pacemaker pocket is well healed Heart- Regular rate and rhythm, no murmurs, rubs or gallops, PMI not laterally displaced GI- soft, NT, ND, + BS Extremities- no clubbing, cyanosis, or edema  Pacemaker interrogation- reviewed in detail today,  See PACEART report  Assessment and Plan:  1. Symptomatic Bradycardia Normal pacemaker function See Pace Art report No changes today Noise noted on her atrial lead which is chronic. function is normal Will follow  2. HTN Stable No change required today  Return in 1 year Declines merlin currently Will see device nurse in 6 months  Hillis RangeJames Arayna Illescas MD, [facility] 09/16/2015 10:19 AM

## 2015-09-16 NOTE — Patient Instructions (Signed)
Medication Instructions:  Continue all current medications.  Labwork: none  Testing/Procedures: none  Follow-Up: Your physician wants you to follow up in:  1 year.  You will receive a reminder letter in the mail one-two months in advance.  If you don't receive a letter, please call our office to schedule the follow up appointment   Any Other Special Instructions Will Be Listed Below (If Applicable). Device Clinic - recall for 6 months.   If you need a refill on your cardiac medications before your next appointment, please call your pharmacy.

## 2015-09-17 LAB — CUP PACEART INCLINIC DEVICE CHECK
Battery Voltage: 2.93 V
Brady Statistic RA Percent Paced: 89 %
Brady Statistic RV Percent Paced: 12 %
Date Time Interrogation Session: 20170830132737
Implantable Lead Implant Date: 20110603
Implantable Lead Location: 753859
Lead Channel Impedance Value: 400 Ohm
Lead Channel Pacing Threshold Amplitude: 0.75 V
Lead Channel Pacing Threshold Amplitude: 1 V
Lead Channel Pacing Threshold Pulse Width: 0.5 ms
Lead Channel Pacing Threshold Pulse Width: 0.5 ms
Lead Channel Pacing Threshold Pulse Width: 0.5 ms
Lead Channel Sensing Intrinsic Amplitude: 2.1 mV
Lead Channel Setting Pacing Amplitude: 2 V
Lead Channel Setting Pacing Pulse Width: 0.5 ms
Lead Channel Setting Sensing Sensitivity: 2 mV
MDC IDC LEAD IMPLANT DT: 20110603
MDC IDC LEAD LOCATION: 753860
MDC IDC MSMT BATTERY REMAINING LONGEVITY: 85.2
MDC IDC MSMT LEADCHNL RA IMPEDANCE VALUE: 325 Ohm
MDC IDC MSMT LEADCHNL RA PACING THRESHOLD AMPLITUDE: 0.75 V
MDC IDC MSMT LEADCHNL RV PACING THRESHOLD AMPLITUDE: 1 V
MDC IDC MSMT LEADCHNL RV PACING THRESHOLD PULSEWIDTH: 0.5 ms
MDC IDC MSMT LEADCHNL RV SENSING INTR AMPL: 7 mV
MDC IDC SET LEADCHNL RV PACING AMPLITUDE: 2.5 V
Pulse Gen Model: 2210
Pulse Gen Serial Number: 7136330

## 2015-09-18 ENCOUNTER — Encounter: Payer: Medicare Other | Admitting: Internal Medicine

## 2015-11-16 ENCOUNTER — Encounter (INDEPENDENT_AMBULATORY_CARE_PROVIDER_SITE_OTHER): Payer: Self-pay | Admitting: Internal Medicine

## 2015-11-16 ENCOUNTER — Encounter (INDEPENDENT_AMBULATORY_CARE_PROVIDER_SITE_OTHER): Payer: Self-pay

## 2015-11-26 ENCOUNTER — Ambulatory Visit (INDEPENDENT_AMBULATORY_CARE_PROVIDER_SITE_OTHER): Payer: Medicare Other | Admitting: Internal Medicine

## 2015-11-26 ENCOUNTER — Encounter (INDEPENDENT_AMBULATORY_CARE_PROVIDER_SITE_OTHER): Payer: Self-pay

## 2015-11-26 ENCOUNTER — Encounter (INDEPENDENT_AMBULATORY_CARE_PROVIDER_SITE_OTHER): Payer: Self-pay | Admitting: Internal Medicine

## 2015-11-26 VITALS — BP 110/62 | HR 64 | Temp 97.8°F | Ht 61.0 in | Wt 135.1 lb

## 2015-11-26 DIAGNOSIS — K5909 Other constipation: Secondary | ICD-10-CM | POA: Diagnosis not present

## 2015-11-26 NOTE — Progress Notes (Signed)
Subjective:    Patient ID: Amber DexterAnn S Kiker, female    DOB: [birth year], 82 y.o.   MRN: [id number]  HPI Referred by Dr. Tyrone AppleMichael A. Caplain for abdominal pain. She says she has been sick since May. She has a pacemaker. She has had diarrhea/constipation for a year. Symptoms started after she had bronchitis. She says every morning She wakes up with a sharp pain in her lower abdomen. She says it feels like a strong urge to have a BM but she doesn't have one.  She can't have a BM until after she walks around awhile in the am.  She usually has a BM daily as long as she takes the [employer]. She feels sleepy all the time.  Has seen Dr. Samuella CotaPandya in the past for same.  She has increased fiber in her diet.  She has never undergone a colonoscopy.  06/08/2015 H and H 12.4 and 36.9, MCV 95  Review of Systems Past Medical History:  Diagnosis Date  . Bradycardia    S/p SJM PPM 6/11  . History of recurrent vertebral fractures   . Hypertension   . Restless leg syndrome   . Right bundle branch block and left posterior fascicular block     Past Surgical History:  Procedure Laterality Date  . PACEMAKER INSERTION    . RIGHT LOWER LEG VEIN STRIPPING    . SJM PPM  06/11   by JA for SSS and syncope  . trigger finger surgery      Allergies  Allergen Reactions  . Celecoxib     REACTION: hearing loss  . Epinephrine   . Quinine     REACTION: rash  . Sulfa Antibiotics   . Tramadol     Current Outpatient Prescriptions on File Prior to Visit  Medication Sig Dispense Refill  . acetaminophen (TYLENOL) 500 MG tablet Take 500 mg by mouth every 6 (six) hours as needed.    Marland Kitchen. amLODipine (NORVASC) 10 MG tablet Take 10 mg by mouth daily.      . citalopram (CELEXA) 40 MG tablet Take 40 mg by mouth daily.      . clonazePAM (KLONOPIN) 1 MG tablet Take 1 mg by mouth daily.      . ferrous fumarate (HEMOCYTE - 106 MG FE) 325 (106 FE) MG TABS Take 1 tablet by mouth 2 (two) times daily.    . metoprolol (LOPRESSOR) 50  MG tablet Take 50 mg by mouth 2 (two) times daily.    . Multiple Vitamin (MULTIVITAMIN) tablet Take 1 tablet by mouth daily.      . primidone (MYSOLINE) 50 MG tablet Take 75 mg by mouth daily.     Marland Kitchen. terazosin (HYTRIN) 2 MG capsule Take 2 mg by mouth at bedtime.       No current facility-administered medications on file prior to visit.        Objective:   Physical Exam Blood pressure 110/62, pulse 64, temperature 97.8 F (36.6 C), height 5\' 1"  (1.549 m), weight 135 lb 1.6 oz (61.3 kg).  Alert and oriented. Skin warm and dry. Oral mucosa is moist.   . Sclera anicteric, conjunctivae is pink. Thyroid not enlarged. No cervical lymphadenopathy. Lungs clear. Heart regular rate and rhythm.  Abdomen is soft. Bowel sounds are positive. No hepatomegaly. No abdominal masses felt. No tenderness.  No edema to lower extremities.        Assessment & Plan:  Constipation. Am going to try her on Miralax daily.  Stool diary  OV in 3 months. 

## 2015-11-26 NOTE — Patient Instructions (Signed)
MIralax daily OV in 3 months Stool dairy.

## 2016-01-01 DIAGNOSIS — I2699 Other pulmonary embolism without acute cor pulmonale: Secondary | ICD-10-CM

## 2016-01-01 HISTORY — DX: Other pulmonary embolism without acute cor pulmonale: I26.99

## 2016-01-13 ENCOUNTER — Ambulatory Visit: Payer: Medicare Other | Admitting: Physician Assistant

## 2016-02-26 ENCOUNTER — Ambulatory Visit (INDEPENDENT_AMBULATORY_CARE_PROVIDER_SITE_OTHER): Payer: Medicare Other | Admitting: Internal Medicine

## 2016-03-25 ENCOUNTER — Encounter: Payer: Self-pay | Admitting: *Deleted

## 2016-03-25 ENCOUNTER — Ambulatory Visit (INDEPENDENT_AMBULATORY_CARE_PROVIDER_SITE_OTHER): Payer: Medicare Other | Admitting: Internal Medicine

## 2016-03-25 DIAGNOSIS — I10 Essential (primary) hypertension: Secondary | ICD-10-CM | POA: Diagnosis not present

## 2016-03-25 DIAGNOSIS — I483 Typical atrial flutter: Secondary | ICD-10-CM

## 2016-03-25 DIAGNOSIS — I495 Sick sinus syndrome: Secondary | ICD-10-CM

## 2016-03-25 MED ORDER — FLECAINIDE ACETATE 50 MG PO TABS: 50.0000 mg | ORAL_TABLET | Freq: Two times a day (BID) | ORAL | 3 refills | Status: DC

## 2016-03-25 NOTE — Patient Instructions (Addendum)
  Your physician has recommended you make the following change in your medication:  INCREASE METOPROLOL TO 150MG  (1 and 1/2 tablets) per day. START FLECAINIDE 50MG  TWICE DAILY.  Your physician recommends that you schedule a follow-up appointment in: DR.ALLRED on 04/08/16 @ 1:30pm  ADDRESS: [address])   [place], [place] [id number]

## 2016-03-28 NOTE — Progress Notes (Signed)
PCP: Glori Bickers, MD  Amber [name] is a 83 y.o. female who presents today for urgent electrophysiology followup.  Since last being seen in our clinic, the patient reports being hospitalized in [place] several times.  I do not currently have records to review.  It appears (per her recollection) that she was hospitalized with pneumonia, developed CDiff, and subsequently atrial flutter with RVR.  She was placed on eliquis and metoprolol.  She is mostly unaware of her atrial arrhythmia but continues to have elevated heart rates.  Today, she denies symptoms of palpitations, exertional chest pain, shortness of breath,  lower extremity edema, dizziness, presyncope, or syncope.  The patient is otherwise without complaint today.   Past Medical History:  Diagnosis Date  . Bradycardia    S/p SJM PPM 6/11  . History of recurrent vertebral fractures   . Hypertension   . Restless leg syndrome   . Right bundle branch block and left posterior fascicular block    Past Surgical History:  Procedure Laterality Date  . PACEMAKER INSERTION    . RIGHT LOWER LEG VEIN STRIPPING    . SJM PPM  06/11   by JA for SSS and syncope  . trigger finger surgery      Current Outpatient Prescriptions  Medication Sig Dispense Refill  . apixaban (ELIQUIS) 2.5 MG TABS tablet Take 2.5 mg by mouth 2 (two) times daily.    . clonazePAM (KLONOPIN) 1 MG tablet Take 1 mg by mouth daily.      Marland Kitchen escitalopram (LEXAPRO) 10 MG tablet Take 10 mg by mouth daily.    Marland Kitchen esomeprazole (NEXIUM) 40 MG capsule Take 40 mg by mouth daily at 12 noon.    . fluticasone furoate-vilanterol (BREO ELLIPTA) 100-25 MCG/INH AEPB Inhale 1 puff into the lungs daily.    . metoprolol succinate (TOPROL-XL) 100 MG 24 hr tablet Take 150 mg by mouth daily. Take with or immediately following a meal.     . Multiple Vitamin (MULTIVITAMIN) tablet Take 1 tablet by mouth daily.      . Multiple Vitamins-Minerals (PRESERVISION AREDS 2 PO) Take by mouth.    . naproxen  sodium (ANAPROX) 220 MG tablet Take 220 mg by mouth daily as needed.    . nitroGLYCERIN (NITROSTAT) 0.4 MG SL tablet Place 0.4 mg under the tongue every 5 (five) minutes as needed for chest pain.    . polyethylene glycol (MIRALAX / GLYCOLAX) packet Take 17 g by mouth daily.    . primidone (MYSOLINE) 50 MG tablet Take 75 mg by mouth daily.     . Probiotic Product (PROBIOTIC-10) CAPS Take by mouth at bedtime.    . Probiotic Product (PROBIOTIC-10) CHEW Chew by mouth every morning.    Marland Kitchen acetaminophen (TYLENOL) 500 MG tablet Take 500 mg by mouth every 6 (six) hours as needed.    Marland Kitchen amLODipine (NORVASC) 10 MG tablet Take 10 mg by mouth daily.      . Biotin 1 MG CAPS Take by mouth.    . cholecalciferol (VITAMIN D) 1000 units tablet Take 1,000 Units by mouth daily.    . citalopram (CELEXA) 40 MG tablet Take 40 mg by mouth daily.      . ferrous fumarate (HEMOCYTE - 106 MG FE) 325 (106 FE) MG TABS Take 1 tablet by mouth 2 (two) times daily.    . flecainide (TAMBOCOR) 50 MG tablet Take 1 tablet (50 mg total) by mouth 2 (two) times daily. 180 tablet 3  . polycarbophil (FIBERCON) 625 MG  tablet Take 625 mg by mouth daily.    Marland Kitchen. terazosin (HYTRIN) 2 MG capsule Take 2 mg by mouth at bedtime.       No current facility-administered medications for this visit.     Physical Exam: Heart rates 120s,  GEN- The patient is thin and frail appearing, alert but a little more confused than usual Head- normocephalic, atraumatic Eyes-  Sclera clear, conjunctiva pink Ears- hearing reduced Oropharynx- clear Lungs- Clear to ausculation bilaterally, normal work of breathing Chest- pacemaker pocket is well healed Heart- tachycardic irregular rhythm, no murmurs, rubs or gallops, PMI not laterally displaced GI- soft, NT, ND, + BS Extremities- no clubbing, cyanosis, or edema  Pacemaker interrogation- personally reviewed in detail today,  See PACEART report  Assessment and Plan:  1. Symptomatic Bradycardia Normal  pacemaker function See Pace Art report No changes today Noise noted on her atrial lead which is chronic. function is normal She is in atrial flutter today.  Multiple attempts at pace termination of atrial flutter were made today which were unsuccessful.  2. Atrial flutter The patient presents in rapidly conducting atrial flutter.  I have increased metoprolol today. chads2vasc score is at least 4.  She is appropriately anticoagulated with eliquis. Pace termination attempts of atrial flutter were not successful today. I have offered urgent cardioversion which she declines. She would prefer "medicines". I will therefore start flecainide 50 mg BID Return in 1-2 weeks for an ekg and follow-up Consider cardioversion if she does not convert on her own.  3. HTN Stable No change required today  4. Recent hospitalization Will obtain records from [place] I have spoken with the patient's daughter in law today who confirms above history.  She worries that the patient is declining and may not be able to live at home much longer.  The patient is very independent and has not yet been accepting of these considerations.  Today, I have spent 40 minutes with the patient discussing her recent hospitalization and atrial flutter.  More than 50% of the visit time today was spent on this issue.  Return to see me in 2 weeks She will contact my office with any problems in the interim.  Hillis RangeJames Raydel Hosick MD, [facility]

## 2016-03-29 LAB — CUP PACEART INCLINIC DEVICE CHECK
Battery Voltage: 2.9 V
Brady Statistic RV Percent Paced: 9.7 %
Implantable Lead Implant Date: 20110603
Implantable Lead Location: 753859
Implantable Lead Location: 753860
Lead Channel Impedance Value: 375 Ohm
Lead Channel Pacing Threshold Amplitude: 1 V
Lead Channel Sensing Intrinsic Amplitude: 1.5 mV
Lead Channel Sensing Intrinsic Amplitude: 7.5 mV
Lead Channel Setting Pacing Amplitude: 2.5 V
Lead Channel Setting Pacing Pulse Width: 0.5 ms
Lead Channel Setting Sensing Sensitivity: 2 mV
MDC IDC LEAD IMPLANT DT: 20110603
MDC IDC MSMT LEADCHNL RA IMPEDANCE VALUE: 325 Ohm
MDC IDC MSMT LEADCHNL RV PACING THRESHOLD AMPLITUDE: 1 V
MDC IDC MSMT LEADCHNL RV PACING THRESHOLD PULSEWIDTH: 0.5 ms
MDC IDC MSMT LEADCHNL RV PACING THRESHOLD PULSEWIDTH: 0.5 ms
MDC IDC PG IMPLANT DT: 20110603
MDC IDC SESS DTM: 20180309211451
MDC IDC SET LEADCHNL RA PACING AMPLITUDE: 2 V
MDC IDC STAT BRADY RA PERCENT PACED: 32 %
Pulse Gen Serial Number: 7136330

## 2016-04-06 ENCOUNTER — Telehealth: Payer: Self-pay | Admitting: Internal Medicine

## 2016-04-06 NOTE — Telephone Encounter (Signed)
LMTCB

## 2016-04-06 NOTE — Telephone Encounter (Signed)
Called pt to confirm appt for 04-08-16. She states she cannot take the Flecainide any more, she can't sleep, trouble with BM's and was to take it twice a day, but taking 1 a day. Doesn't want to wait until appt to come off-pls advise

## 2016-04-08 ENCOUNTER — Ambulatory Visit (INDEPENDENT_AMBULATORY_CARE_PROVIDER_SITE_OTHER): Payer: Medicare Other | Admitting: Internal Medicine

## 2016-04-08 ENCOUNTER — Encounter: Payer: Self-pay | Admitting: *Deleted

## 2016-04-08 ENCOUNTER — Encounter (INDEPENDENT_AMBULATORY_CARE_PROVIDER_SITE_OTHER): Payer: Self-pay

## 2016-04-08 ENCOUNTER — Encounter: Payer: Self-pay | Admitting: Internal Medicine

## 2016-04-08 VITALS — BP 120/90 | HR 108 | Ht 61.0 in | Wt 140.0 lb

## 2016-04-08 DIAGNOSIS — Z01812 Encounter for preprocedural laboratory examination: Secondary | ICD-10-CM

## 2016-04-08 DIAGNOSIS — I495 Sick sinus syndrome: Secondary | ICD-10-CM

## 2016-04-08 DIAGNOSIS — I484 Atypical atrial flutter: Secondary | ICD-10-CM

## 2016-04-08 DIAGNOSIS — R001 Bradycardia, unspecified: Secondary | ICD-10-CM

## 2016-04-08 DIAGNOSIS — I10 Essential (primary) hypertension: Secondary | ICD-10-CM

## 2016-04-08 DIAGNOSIS — Z950 Presence of cardiac pacemaker: Secondary | ICD-10-CM

## 2016-04-08 LAB — CUP PACEART INCLINIC DEVICE CHECK
Date Time Interrogation Session: 20180323152101
Implantable Lead Implant Date: 20110603
Implantable Lead Location: 753859
Implantable Lead Location: 753860
Implantable Pulse Generator Implant Date: 20110603
Lead Channel Impedance Value: 362.5 Ohm
Lead Channel Pacing Threshold Amplitude: 1 V
Lead Channel Pacing Threshold Pulse Width: 0.5 ms
Lead Channel Sensing Intrinsic Amplitude: 1.5 mV
Lead Channel Setting Sensing Sensitivity: 2 mV
MDC IDC LEAD IMPLANT DT: 20110603
MDC IDC MSMT BATTERY VOLTAGE: 2.9 V
MDC IDC MSMT LEADCHNL RA IMPEDANCE VALUE: 312.5 Ohm
MDC IDC MSMT LEADCHNL RV SENSING INTR AMPL: 7.7 mV
MDC IDC SET LEADCHNL RA PACING AMPLITUDE: 2 V
MDC IDC SET LEADCHNL RV PACING AMPLITUDE: 2.5 V
MDC IDC SET LEADCHNL RV PACING PULSEWIDTH: 0.5 ms
MDC IDC STAT BRADY RA PERCENT PACED: 0.1 %
MDC IDC STAT BRADY RV PERCENT PACED: 12 %
Pulse Gen Serial Number: 7136330

## 2016-04-08 NOTE — Patient Instructions (Signed)
Medication Instructions:    Your physician recommends that you continue on your current medications as directed. Please refer to the Current Medication list given to you today.  --- If you need a refill on your cardiac medications before your next appointment, please call your pharmacy. ---  Labwork:  Pre procedure labs: BMET & CBC w/ diff  Testing/Procedures: Your physician has recommended that you have a Cardioversion (DCCV). Electrical Cardioversion uses a jolt of electricity to your heart either through paddles or wired patches attached to your chest. This is a controlled, usually prescheduled, procedure. Defibrillation is done under light anesthesia in the hospital, and you usually go home the day of the procedure. This is done to get your heart back into a normal rhythm. You are not awake for the procedure. Please see the instruction sheet given to you today.  Follow-Up:  Your physician recommends that you schedule a follow-up appointment in: with Dr. Johney Frame the next time he is in the [place] office.   Thank you for choosing [facility]!!      Any Other Special Instructions Will Be Listed Below (If Applicable).   Electrical Cardioversion Electrical cardioversion is the delivery of a jolt of electricity to restore a normal rhythm to the heart. A rhythm that is too fast or is not regular keeps the heart from pumping well. In this procedure, sticky patches or metal paddles are placed on the chest to deliver electricity to the heart from a device. This procedure may be done in an emergency if:  There is low or no blood pressure as a result of the heart rhythm.  Normal rhythm must be restored as fast as possible to protect the brain and heart from further damage.  It may save a life. This procedure may also be done for irregular or fast heart rhythms that are not immediately life-threatening. Tell a health care provider about:  Any allergies you have.  All medicines you are  taking, including vitamins, herbs, eye drops, creams, and over-the-counter medicines.  Any problems you or family members have had with anesthetic medicines.  Any blood disorders you have.  Any surgeries you have had.  Any medical conditions you have.  Whether you are pregnant or may be pregnant. What are the risks? Generally, this is a safe procedure. However, problems may occur, including:  Allergic reactions to medicines.  A blood clot that breaks free and travels to other parts of your body.  The possible return of an abnormal heart rhythm within hours or days after the procedure.  Your heart stopping (cardiac arrest). This is rare. What happens before the procedure? Medicines   Your health care provider may have you start taking:  Blood-thinning medicines (anticoagulants) so your blood does not clot as easily.  Medicines may be given to help stabilize your heart rate and rhythm.  Ask your health care provider about changing or stopping your regular medicines. This is especially important if you are taking diabetes medicines or blood thinners. General instructions   Plan to have someone take you home from the hospital or clinic.  If you will be going home right after the procedure, plan to have someone with you for 24 hours.  Follow instructions from your health care provider about eating or drinking restrictions. What happens during the procedure?  To lower your risk of infection:  Your health care team will wash or sanitize their hands.  Your skin will be washed with soap.  An IV tube will be inserted  into one of your veins.  You will be given a medicine to help you relax (sedative).  Sticky patches (electrodes) or metal paddles may be placed on your chest.  An electrical shock will be delivered. The procedure may vary among health care providers and hospitals. What happens after the procedure?  Your blood pressure, heart rate, breathing rate, and blood  oxygen level will be monitored until the medicines you were given have worn off.  Do not drive for 24 hours if you were given a sedative.  Your heart rhythm will be watched to make sure it does not change. This information is not intended to replace advice given to you by your health care provider. Make sure you discuss any questions you have with your health care provider. Document Released: 12/24/2001 Document Revised: 09/02/2015 Document Reviewed: 07/10/2015 Elsevier Interactive Patient Education  2017 [employer]

## 2016-04-08 NOTE — Telephone Encounter (Signed)
FYI your 1330

## 2016-04-08 NOTE — Progress Notes (Signed)
PCP: Glori Bickers, MD  Amber [name] is a 83 y.o. female who presents today for electrophysiology followup.  She remains in atrial flutter after a recent hospitalization 12/18 for pte, pneumonia, and C diff.  She has fatigue with her atrial flutter.  She seems to tolerate it surprisingly well.  We did start flecainide recently. She did not convert to sinus and feels that she had insomnia related due to flecainide.  She stopped this medicine.  She has also been inconsistent with taking diltiazem.  She is very clear that she has been compliant with her eliquis however.  Today, she denies symptoms of palpitations, exertional chest pain, shortness of breath,  lower extremity edema, dizziness, presyncope, or syncope.  The patient is otherwise without complaint today.   Past Medical History:  Diagnosis Date  . Bradycardia    S/p SJM PPM 6/11  . History of recurrent vertebral fractures   . Hypertension   . Restless leg syndrome   . Right bundle branch block and left posterior fascicular block    Past Surgical History:  Procedure Laterality Date  . PACEMAKER INSERTION    . RIGHT LOWER LEG VEIN STRIPPING    . SJM PPM  06/11   by JA for SSS and syncope  . trigger finger surgery      Current Outpatient Prescriptions  Medication Sig Dispense Refill  . acetaminophen (TYLENOL) 500 MG tablet Take 500 mg by mouth every 6 (six) hours as needed (pain).     Marland Kitchen amLODipine (NORVASC) 10 MG tablet Take 10 mg by mouth daily.      Marland Kitchen apixaban (ELIQUIS) 2.5 MG TABS tablet Take 2.5 mg by mouth 2 (two) times daily.    . Biotin 1 MG CAPS Take by mouth as directed.     . cholecalciferol (VITAMIN D) 1000 units tablet Take 1,000 Units by mouth daily.    . citalopram (CELEXA) 40 MG tablet Take 40 mg by mouth daily.      . clonazePAM (KLONOPIN) 1 MG tablet Take 1 mg by mouth daily.      Marland Kitchen diltiazem (CARDIZEM) 120 MG tablet Take 1 tablet by mouth daily.    Marland Kitchen escitalopram (LEXAPRO) 10 MG tablet Take 10 mg by  mouth daily.    Marland Kitchen esomeprazole (NEXIUM) 40 MG capsule Take 40 mg by mouth daily at 12 noon.    . ferrous fumarate (HEMOCYTE - 106 MG FE) 325 (106 FE) MG TABS Take 1 tablet by mouth 2 (two) times daily.    . flecainide (TAMBOCOR) 50 MG tablet Take 1 tablet (50 mg total) by mouth 2 (two) times daily. 180 tablet 3  . fluticasone furoate-vilanterol (BREO ELLIPTA) 100-25 MCG/INH AEPB Inhale 1 puff into the lungs daily.    . metoprolol succinate (TOPROL-XL) 100 MG 24 hr tablet Take 150 mg by mouth daily. Take with or immediately following a meal.     . montelukast (SINGULAIR) 10 MG tablet Take 1 tablet by mouth daily.    . Multiple Vitamin (MULTIVITAMIN) tablet Take 1 tablet by mouth daily.      . Multiple Vitamins-Minerals (PRESERVISION AREDS 2 PO) Take by mouth as directed.     . naproxen sodium (ANAPROX) 220 MG tablet Take 220 mg by mouth daily as needed.    . nitroGLYCERIN (NITROSTAT) 0.4 MG SL tablet Place 0.4 mg under the tongue every 5 (five) minutes as needed for chest pain.    . polycarbophil (FIBERCON) 625 MG tablet Take 625 mg by mouth daily.    Marland Kitchen  polyethylene glycol (MIRALAX / GLYCOLAX) packet Take 17 g by mouth daily.    . primidone (MYSOLINE) 50 MG tablet Take 75 mg by mouth daily.     . Probiotic Product (PROBIOTIC-10) CAPS Take 1 capsule by mouth at bedtime.     . Probiotic Product (PROBIOTIC-10) CHEW Chew 1 each by mouth every morning.     . terazosin (HYTRIN) 2 MG capsule Take 2 mg by mouth at bedtime.      . traZODone (DESYREL) 50 MG tablet Take 1 tablet by mouth daily as needed for sleep.      No current facility-administered medications for this visit.     Physical Exam: Vitals:   04/08/16 1329  BP: 120/90  Pulse: (!) 108   GEN- The patient is thin and frail appearing, alert, she has her daugher in law with her today Head- normocephalic, atraumatic Eyes-  Sclera clear, conjunctiva pink Ears- hearing reduced Oropharynx- clear Lungs- Clear to ausculation bilaterally,  normal work of breathing Chest- pacemaker pocket is well healed Heart- tachycardic irregular rhythm, no murmurs, rubs or gallops, PMI not laterally displaced GI- soft, NT, ND, + BS Extremities- no clubbing, cyanosis, or edema  Pacemaker interrogation- personally reviewed in detail today,  See PACEART report  [facility]  ekg today is personally reviewed and reveals atrial flutter 109 bpm, RBBB  Assessment and Plan:  1. Symptomatic Bradycardia Normal pacemaker function See Pace Art report No changes today Noise noted on her atrial lead which is chronic. function is normal  2. Atrial flutter (atypical) The patient presents in rapidly conducting atrial flutter. She reports compliance with eliquis She did not tolerate flecainide due to insomnia. As her flutter seems to have been induced by PTE and pneumonia in December, I am optimistic that she may hold sinus rhythm with cardioversion.  Risks, benefits, and alternatives to cardioversion were discussed with the patient who wishes to proceed.  We will scheduled at the next available time. Consider amiodarone if her atrial flutter returns after cardioversion  3. HTN Stable No change required today   Today, I have spent 40 minutes with the patient discussing her atrial flutter and trying to understand her medications.  More than 50% of the visit time today was spent on this issue.  Return to see me the next time that I am in [place] office  Hillis RangeJames Katasha Riga MD, [facility]

## 2016-04-09 LAB — CBC WITH DIFFERENTIAL/PLATELET
BASOS ABS: 0 10*3/uL (ref 0.0–0.2)
BASOS: 0 %
EOS (ABSOLUTE): 0.1 10*3/uL (ref 0.0–0.4)
Eos: 2 %
HEMOGLOBIN: 11.6 g/dL (ref 11.1–15.9)
Hematocrit: 36 % (ref 34.0–46.6)
IMMATURE GRANS (ABS): 0 10*3/uL (ref 0.0–0.1)
Immature Granulocytes: 0 %
LYMPHS ABS: 1.6 10*3/uL (ref 0.7–3.1)
LYMPHS: 23 %
MCH: 28.6 pg (ref 26.6–33.0)
MCHC: 32.2 g/dL (ref 31.5–35.7)
MCV: 89 fL (ref 79–97)
Monocytes Absolute: 0.6 10*3/uL (ref 0.1–0.9)
Monocytes: 8 %
NEUTROS ABS: 4.8 10*3/uL (ref 1.4–7.0)
Neutrophils: 67 %
Platelets: 205 10*3/uL (ref 150–379)
RBC: 4.05 x10E6/uL (ref 3.77–5.28)
RDW: 14.3 % (ref 12.3–15.4)
WBC: 7.2 10*3/uL (ref 3.4–10.8)

## 2016-04-09 LAB — BASIC METABOLIC PANEL
BUN / CREAT RATIO: 29 — AB (ref 12–28)
BUN: 29 mg/dL (ref 10–36)
CO2: 26 mmol/L (ref 18–29)
Calcium: 9.4 mg/dL (ref 8.7–10.3)
Chloride: 99 mmol/L (ref 96–106)
Creatinine, Ser: 1 mg/dL (ref 0.57–1.00)
GFR, EST AFRICAN AMERICAN: 57 mL/min/{1.73_m2} — AB (ref >59)
GFR, EST NON AFRICAN AMERICAN: 49 mL/min/{1.73_m2} — AB (ref >59)
Glucose: 91 mg/dL (ref 65–99)
POTASSIUM: 5.2 mmol/L (ref 3.5–5.2)
SODIUM: 141 mmol/L (ref 134–144)

## 2016-04-12 ENCOUNTER — Ambulatory Visit (HOSPITAL_COMMUNITY)
Admission: RE | Admit: 2016-04-12 | Discharge: 2016-04-12 | Disposition: A | Discharge status: Home / Self Care | Payer: Medicare Other | Source: Ambulatory Visit | Attending: Cardiology | Admitting: Cardiology

## 2016-04-12 ENCOUNTER — Ambulatory Visit (HOSPITAL_COMMUNITY): Payer: Medicare Other | Admitting: Critical Care Medicine

## 2016-04-12 ENCOUNTER — Encounter (HOSPITAL_COMMUNITY)
Admission: RE | Disposition: A | Discharge status: Home / Self Care | Payer: Self-pay | Source: Ambulatory Visit | Attending: Cardiology

## 2016-04-12 ENCOUNTER — Encounter (HOSPITAL_COMMUNITY): Payer: Self-pay | Admitting: *Deleted

## 2016-04-12 DIAGNOSIS — Z9889 Other specified postprocedural states: Secondary | ICD-10-CM | POA: Insufficient documentation

## 2016-04-12 DIAGNOSIS — Z7901 Long term (current) use of anticoagulants: Secondary | ICD-10-CM | POA: Insufficient documentation

## 2016-04-12 DIAGNOSIS — G4700 Insomnia, unspecified: Secondary | ICD-10-CM | POA: Insufficient documentation

## 2016-04-12 DIAGNOSIS — Z950 Presence of cardiac pacemaker: Secondary | ICD-10-CM | POA: Insufficient documentation

## 2016-04-12 DIAGNOSIS — I4510 Unspecified right bundle-branch block: Secondary | ICD-10-CM | POA: Insufficient documentation

## 2016-04-12 DIAGNOSIS — Z7951 Long term (current) use of inhaled steroids: Secondary | ICD-10-CM | POA: Diagnosis not present

## 2016-04-12 DIAGNOSIS — E8809 Other disorders of plasma-protein metabolism, not elsewhere classified: Secondary | ICD-10-CM | POA: Diagnosis not present

## 2016-04-12 DIAGNOSIS — I484 Atypical atrial flutter: Secondary | ICD-10-CM

## 2016-04-12 DIAGNOSIS — D649 Anemia, unspecified: Secondary | ICD-10-CM | POA: Insufficient documentation

## 2016-04-12 DIAGNOSIS — Z79899 Other long term (current) drug therapy: Secondary | ICD-10-CM | POA: Diagnosis not present

## 2016-04-12 DIAGNOSIS — I10 Essential (primary) hypertension: Secondary | ICD-10-CM | POA: Insufficient documentation

## 2016-04-12 DIAGNOSIS — I4892 Unspecified atrial flutter: Secondary | ICD-10-CM | POA: Diagnosis not present

## 2016-04-12 DIAGNOSIS — Z791 Long term (current) use of non-steroidal anti-inflammatories (NSAID): Secondary | ICD-10-CM | POA: Diagnosis not present

## 2016-04-12 DIAGNOSIS — G2581 Restless legs syndrome: Secondary | ICD-10-CM | POA: Insufficient documentation

## 2016-04-12 HISTORY — PX: CARDIOVERSION: SHX1299

## 2016-04-12 SURGERY — CARDIOVERSION
Anesthesia: General

## 2016-04-12 MED ORDER — PROPOFOL 10 MG/ML IV BOLUS
INTRAVENOUS | Status: DC | PRN
  Administered 2016-04-12: 60 mg via INTRAVENOUS

## 2016-04-12 MED ORDER — LIDOCAINE 2% (20 MG/ML) 5 ML SYRINGE
INTRAMUSCULAR | Status: DC | PRN
  Administered 2016-04-12: 40 mg via INTRAVENOUS

## 2016-04-12 MED ORDER — SODIUM CHLORIDE 0.9 % IV SOLN
INTRAVENOUS | Status: DC
  Administered 2016-04-12: 14:00:00 via INTRAVENOUS

## 2016-04-12 NOTE — Anesthesia Preprocedure Evaluation (Addendum)
Anesthesia Evaluation  Patient identified by MRN, date of birth, ID band Patient awake    Reviewed: Allergy & Precautions, NPO status , Patient's Chart, lab work & pertinent test results  History of Anesthesia Complications (+) PSEUDOCHOLINESTERASE DEFICIENCY  Airway Mallampati: III  TM Distance: >3 FB Neck ROM: Full    Dental  (+) Dental Advisory Given, Teeth Intact   Pulmonary    breath sounds clear to auscultation       Cardiovascular hypertension, Pt. on medications + dysrhythmias Atrial Fibrillation  Rhythm:Regular Rate:Normal     Neuro/Psych    GI/Hepatic   Endo/Other    Renal/GU      Musculoskeletal   Abdominal   Peds  Hematology  (+) anemia ,   Anesthesia Other Findings   Reproductive/Obstetrics                           Anesthesia Physical Anesthesia Plan  ASA: III  Anesthesia Plan: General   Post-op Pain Management:    Induction: Intravenous  Airway Management Planned: Mask  Additional Equipment:   Intra-op Plan:   Post-operative Plan:   Informed Consent: I have reviewed the patients History and Physical, chart, labs and discussed the procedure including the risks, benefits and alternatives for the proposed anesthesia with the patient or authorized representative who has indicated his/her understanding and acceptance.   Dental advisory given  Plan Discussed with: Anesthesiologist, Surgeon and CRNA  Anesthesia Plan Comments:        Anesthesia Quick Evaluation

## 2016-04-12 NOTE — Procedures (Addendum)
Electrical Cardioversion Procedure Note Gerald Dexternn S Yebra [id number] 1922/06/27   Procedure: Electrical Cardioversion Indications:  Atypical atrial flutter.   Procedure Details Consent: Risks of procedure as well as the alternatives and risks of each were explained to the (patient/caregiver).  Consent for procedure obtained. Time Out: Verified patient identification, verified procedure, site/side was marked, verified correct patient position, special equipment/implants available, medications/allergies/relevent history reviewed, required imaging and test results available.  Performed  Patient placed on cardiac monitor, pulse oximetry, supplemental oxygen as necessary.  Sedation given: Propofol per anesthesiology Pacer pads placed anterior and posterior chest.  Cardioverted 1 time(s).  Cardioverted at 120J.  Evaluation Findings: Post procedure EKG shows: NSR Complications: None Patient did tolerate procedure well.  Pacemaker checked after procedure, functioning normally.    Marca AnconaDalton McLean 04/12/2016, 1:45 PM

## 2016-04-12 NOTE — Discharge Instructions (Signed)
Electrical Cardioversion, Care After °This sheet gives you information about how to care for yourself after your procedure. Your health care provider may also give you more specific instructions. If you have problems or questions, contact your health care provider. °What can I expect after the procedure? °After the procedure, it is common to have: °· Some redness on the skin where the shocks were given. °Follow these instructions at home: °· Do not drive for 24 hours if you were given a medicine to help you relax (sedative). °· Take over-the-counter and prescription medicines only as told by your health care provider. °· Ask your health care provider how to check your pulse. Check it often. °· Rest for 48 hours after the procedure or as told by your health care provider. °· Avoid or limit your caffeine use as told by your health care provider. °Contact a health care provider if: °· You feel like your heart is beating too quickly or your pulse is not regular. °· You have a serious muscle cramp that does not go away. °Get help right away if: °· You have discomfort in your chest. °· You are dizzy or you feel faint. °· You have trouble breathing or you are short of breath. °· Your speech is slurred. °· You have trouble moving an arm or leg on one side of your body. °· Your fingers or toes turn cold or blue. °This information is not intended to replace advice given to you by your health care provider. Make sure you discuss any questions you have with your health care provider. °Document Released: 10/24/2012 Document Revised: 08/07/2015 Document Reviewed: 07/10/2015 °Elsevier Interactive Patient Education © 2017 [employer] Inc. ° °

## 2016-04-12 NOTE — H&P (View-Only) (Signed)
 PCP: TRIVEDI,RAJENDRA, MD  Amber [name] is a 83 y.o. female who presents today for electrophysiology followup.  She remains in atrial flutter after a recent hospitalization 12/18 for pte, pneumonia, and C diff.  She has fatigue with her atrial flutter.  She seems to tolerate it surprisingly well.  We did start flecainide recently. She did not convert to sinus and feels that she had insomnia related due to flecainide.  She stopped this medicine.  She has also been inconsistent with taking diltiazem.  She is very clear that she has been compliant with her eliquis however.  Today, she denies symptoms of palpitations, exertional chest pain, shortness of breath,  lower extremity edema, dizziness, presyncope, or syncope.  The patient is otherwise without complaint today.   Past Medical History:  Diagnosis Date  . Bradycardia    S/p SJM PPM 6/11  . History of recurrent vertebral fractures   . Hypertension   . Restless leg syndrome   . Right bundle branch block and left posterior fascicular block    Past Surgical History:  Procedure Laterality Date  . PACEMAKER INSERTION    . RIGHT LOWER LEG VEIN STRIPPING    . SJM PPM  06/11   by JA for SSS and syncope  . trigger finger surgery      Current Outpatient Prescriptions  Medication Sig Dispense Refill  . acetaminophen (TYLENOL) 500 MG tablet Take 500 mg by mouth every 6 (six) hours as needed (pain).     . amLODipine (NORVASC) 10 MG tablet Take 10 mg by mouth daily.      . apixaban (ELIQUIS) 2.5 MG TABS tablet Take 2.5 mg by mouth 2 (two) times daily.    . Biotin 1 MG CAPS Take by mouth as directed.     . cholecalciferol (VITAMIN D) 1000 units tablet Take 1,000 Units by mouth daily.    . citalopram (CELEXA) 40 MG tablet Take 40 mg by mouth daily.      . clonazePAM (KLONOPIN) 1 MG tablet Take 1 mg by mouth daily.      . diltiazem (CARDIZEM) 120 MG tablet Take 1 tablet by mouth daily.    . escitalopram (LEXAPRO) 10 MG tablet Take 10 mg by  mouth daily.    . esomeprazole (NEXIUM) 40 MG capsule Take 40 mg by mouth daily at 12 noon.    . ferrous fumarate (HEMOCYTE - 106 MG FE) 325 (106 FE) MG TABS Take 1 tablet by mouth 2 (two) times daily.    . flecainide (TAMBOCOR) 50 MG tablet Take 1 tablet (50 mg total) by mouth 2 (two) times daily. 180 tablet 3  . fluticasone furoate-vilanterol (BREO ELLIPTA) 100-25 MCG/INH AEPB Inhale 1 puff into the lungs daily.    . metoprolol succinate (TOPROL-XL) 100 MG 24 hr tablet Take 150 mg by mouth daily. Take with or immediately following a meal.     . montelukast (SINGULAIR) 10 MG tablet Take 1 tablet by mouth daily.    . Multiple Vitamin (MULTIVITAMIN) tablet Take 1 tablet by mouth daily.      . Multiple Vitamins-Minerals (PRESERVISION AREDS 2 PO) Take by mouth as directed.     . naproxen sodium (ANAPROX) 220 MG tablet Take 220 mg by mouth daily as needed.    . nitroGLYCERIN (NITROSTAT) 0.4 MG SL tablet Place 0.4 mg under the tongue every 5 (five) minutes as needed for chest pain.    . polycarbophil (FIBERCON) 625 MG tablet Take 625 mg by mouth daily.    .   polyethylene glycol (MIRALAX / GLYCOLAX) packet Take 17 g by mouth daily.    . primidone (MYSOLINE) 50 MG tablet Take 75 mg by mouth daily.     . Probiotic Product (PROBIOTIC-10) CAPS Take 1 capsule by mouth at bedtime.     . Probiotic Product (PROBIOTIC-10) CHEW Chew 1 each by mouth every morning.     . terazosin (HYTRIN) 2 MG capsule Take 2 mg by mouth at bedtime.      . traZODone (DESYREL) 50 MG tablet Take 1 tablet by mouth daily as needed for sleep.      No current facility-administered medications for this visit.     Physical Exam: Vitals:   04/08/16 1329  BP: 120/90  Pulse: (!) 108   GEN- The patient is thin and frail appearing, alert, she has her daugher in law with her today Head- normocephalic, atraumatic Eyes-  Sclera clear, conjunctiva pink Ears- hearing reduced Oropharynx- clear Lungs- Clear to ausculation bilaterally,  normal work of breathing Chest- pacemaker pocket is well healed Heart- tachycardic irregular rhythm, no murmurs, rubs or gallops, PMI not laterally displaced GI- soft, NT, ND, + BS Extremities- no clubbing, cyanosis, or edema  Pacemaker interrogation- personally reviewed in detail today,  See PACEART report  Records from recent hospitalization at [facility] are reviewed in detail today  ekg today is personally reviewed and reveals atrial flutter 109 bpm, RBBB  Assessment and Plan:  1. Symptomatic Bradycardia Normal pacemaker function See Pace Art report No changes today Noise noted on her atrial lead which is chronic. function is normal  2. Atrial flutter (atypical) The patient presents in rapidly conducting atrial flutter. She reports compliance with eliquis She did not tolerate flecainide due to insomnia. As her flutter seems to have been induced by PTE and pneumonia in December, I am optimistic that she may hold sinus rhythm with cardioversion.  Risks, benefits, and alternatives to cardioversion were discussed with the patient who wishes to proceed.  We will scheduled at the next available time. Consider amiodarone if her atrial flutter returns after cardioversion  3. HTN Stable No change required today   Today, I have spent 40 minutes with the patient discussing her atrial flutter and trying to understand her medications.  More than 50% of the visit time today was spent on this issue.  Return to see me the next time that I am in [place] office  Gurpreet Mikhail MD, FACC    

## 2016-04-12 NOTE — Interval H&P Note (Signed)
History and Physical Interval Note:  04/12/2016 1:38 PM  Amber DexterAnn S [name]  has presented today for surgery, with the diagnosis of AFLUTTER  The various methods of treatment have been discussed with the patient and family. After consideration of risks, benefits and other options for treatment, the patient has consented to  Procedure(s): CARDIOVERSION (N/A) as a surgical intervention .  The patient's history has been reviewed, patient examined, no change in status, stable for surgery.  I have reviewed the patient's chart and labs.  Questions were answered to the patient's satisfaction.     Dalton [employer] Energy[employer]Lean

## 2016-04-12 NOTE — Anesthesia Procedure Notes (Signed)
Date/Time: 04/12/2016 1:41 PM Performed by: Glo HerringLEE, [place] B Pre-anesthesia Checklist: Patient identified, Emergency Drugs available, Suction available, Timeout performed and Patient being monitored Patient Re-evaluated:Patient Re-evaluated prior to inductionOxygen Delivery Method: Ambu bag Preoxygenation: Pre-oxygenation with 100% oxygen Intubation Type: IV induction Placement Confirmation: breath sounds checked- equal and bilateral Dental Injury: Teeth and Oropharynx as per pre-operative assessment

## 2016-04-12 NOTE — Anesthesia Postprocedure Evaluation (Signed)
Anesthesia Post Note  Patient: Amber [name]  Procedure(s) Performed: Procedure(s) (LRB): CARDIOVERSION (N/A)  Patient location during evaluation: PACU Anesthesia Type: General Level of consciousness: awake Pain management: pain level controlled Respiratory status: spontaneous breathing Cardiovascular status: stable Anesthetic complications: no       Last Vitals:  Vitals:   04/12/16 1420 04/12/16 1430  BP: (!) 150/95 (!) 172/78  Pulse: 65 64  Resp: 17 13  Temp:      Last Pain:  Vitals:   04/12/16 1351  TempSrc: Oral                 Robbi Scurlock

## 2016-04-12 NOTE — Transfer of Care (Signed)
Immediate Anesthesia Transfer of Care Note  Patient: Amber [name]  Procedure(s) Performed: Procedure(s): CARDIOVERSION (N/A)  Patient Location: Endoscopy Unit  Anesthesia Type:General  Level of Consciousness: awake, alert  and oriented  Airway & Oxygen Therapy: Patient Spontanous Breathing and Patient connected to nasal cannula oxygen  Post-op Assessment: Report given to RN and Post -op Vital signs reviewed and stable  Post vital signs: Reviewed and stable  Last Vitals:  Vitals:   04/12/16 1305 04/12/16 1351  BP: (!) 184/116 (!) (P) 117/56  Pulse: (!) 105 (P) 61  Resp: 18 (P) 18  Temp: 36.4 C (P) 36.4 C    Last Pain:  Vitals:   04/12/16 1351  TempSrc: (P) Oral         Complications: No apparent anesthesia complications

## 2016-04-13 ENCOUNTER — Encounter (HOSPITAL_COMMUNITY): Payer: Self-pay | Admitting: Cardiology

## 2016-04-18 ENCOUNTER — Telehealth: Payer: Self-pay | Admitting: Internal Medicine

## 2016-04-18 NOTE — Telephone Encounter (Signed)
Amber [name] called stating that she thinks one of her medications is causing her to stay awake  She would like to discuss.

## 2016-04-18 NOTE — Telephone Encounter (Signed)
Patient stated that she has not been able to sleep at night for several days now. Patient stopped taking lexapro and flecainide thinking these were the causes of her not being able to sleep. After stopping those for a week she noticed no change, so she began taking those again. Patient has trazodone as needed for sleeping. Advised patient to try taking this to see if it would help. If she was still having trouble sleeping she would need to contact her PCP.

## 2016-05-10 ENCOUNTER — Encounter: Payer: Medicare Other | Admitting: Internal Medicine

## 2016-07-08 ENCOUNTER — Encounter: Payer: Self-pay | Admitting: Internal Medicine

## 2016-07-08 ENCOUNTER — Ambulatory Visit (INDEPENDENT_AMBULATORY_CARE_PROVIDER_SITE_OTHER): Payer: Medicare Other | Admitting: Internal Medicine

## 2016-07-08 VITALS — BP 152/71 | HR 60 | Ht 61.0 in | Wt 141.0 lb

## 2016-07-08 DIAGNOSIS — I495 Sick sinus syndrome: Secondary | ICD-10-CM | POA: Diagnosis not present

## 2016-07-08 DIAGNOSIS — I10 Essential (primary) hypertension: Secondary | ICD-10-CM | POA: Diagnosis not present

## 2016-07-08 DIAGNOSIS — R001 Bradycardia, unspecified: Secondary | ICD-10-CM

## 2016-07-08 MED ORDER — METOPROLOL SUCCINATE ER 100 MG PO TB24: 100.0000 mg | ORAL_TABLET | Freq: Every day | ORAL | 3 refills | Status: DC

## 2016-07-08 NOTE — Progress Notes (Signed)
PCP: Glori Bickers, MD Primary Cardiologist:  Amber [name] is a 83 y.o. female who presents today for routine electrophysiology followup.  Since last being seen in our clinic, the patient reports doing very well. She has been in sinus since her cardioversion and feels better.  Today, she denies symptoms of palpitations, chest pain, shortness of breath,  lower extremity edema, dizziness, presyncope, or syncope.  The patient is otherwise without complaint today.   Past Medical History:  Diagnosis Date  . Bradycardia    S/p SJM PPM 6/11  . History of recurrent vertebral fractures   . Hypertension   . Hyponatremia   . Iron deficiency anemia   . Pulmonary emboli (HCC) 01/01/2016  . Restless leg syndrome   . Right bundle branch block and left posterior fascicular block    Past Surgical History:  Procedure Laterality Date  . CARDIOVERSION N/A 04/12/2016   Procedure: CARDIOVERSION;  Surgeon: Laurey Morale, MD;  Location: [facility] ENDOSCOPY;  Service: Cardiovascular;  Laterality: N/A;  . PACEMAKER INSERTION    . RIGHT LOWER LEG VEIN STRIPPING    . SJM PPM  06/11   by JA for SSS and syncope  . trigger finger surgery      ROS- all systems are reviewed and negative except as per HPI above  Current Outpatient Prescriptions  Medication Sig Dispense Refill  . amLODipine (NORVASC) 10 MG tablet Take 10 mg by mouth daily.      Marland Kitchen apixaban (ELIQUIS) 2.5 MG TABS tablet Take 2.5 mg by mouth 2 (two) times daily.    . Biotin 1 MG CAPS Take by mouth as directed.     . cholecalciferol (VITAMIN D) 1000 units tablet Take 1,000 Units by mouth daily.    . clonazePAM (KLONOPIN) 1 MG tablet Take 1 mg by mouth daily.      Marland Kitchen escitalopram (LEXAPRO) 10 MG tablet Take 10 mg by mouth daily.    Marland Kitchen esomeprazole (NEXIUM) 40 MG capsule Take 40 mg by mouth daily at 12 noon.    . fluticasone furoate-vilanterol (BREO ELLIPTA) 100-25 MCG/INH AEPB Inhale 1 puff into the lungs daily.    . metoprolol succinate (TOPROL-XL) 100  MG 24 hr tablet Take 150 mg by mouth daily. Take with or immediately following a meal.     . Multiple Vitamin (MULTIVITAMIN) tablet Take 1 tablet by mouth daily.      . Multiple Vitamins-Minerals (PRESERVISION AREDS 2 PO) Take by mouth as directed.     . naproxen sodium (ANAPROX) 220 MG tablet Take 220 mg by mouth daily as needed.    . nitroGLYCERIN (NITROSTAT) 0.4 MG SL tablet Place 0.4 mg under the tongue every 5 (five) minutes as needed for chest pain.    . polyethylene glycol (MIRALAX / GLYCOLAX) packet Take 17 g by mouth daily.    . primidone (MYSOLINE) 50 MG tablet Take 75 mg by mouth daily.     . Probiotic Product (PROBIOTIC-10) CAPS Take 1 capsule by mouth at bedtime.     . traZODone (DESYREL) 50 MG tablet Take 1 tablet by mouth daily as needed for sleep.      No current facility-administered medications for this visit.     Physical Exam: Vitals:   07/08/16 1128  BP: (!) 152/71  Pulse: 60  SpO2: 94%  Weight: 141 lb (64 kg)  Height: 5\' 1"  (1.549 m)    GEN- The patient is well appearing, alert and oriented x 3 today.   Head- normocephalic, atraumatic Eyes-  Sclera clear, conjunctiva pink Ears- hearing intact Oropharynx- clear Lungs- Clear to ausculation bilaterally, normal work of breathing Chest- pacemaker pocket is well healed Heart- Regular rate and rhythm, no murmurs, rubs or gallops, PMI not laterally displaced GI- soft, NT, ND, + BS Extremities- no clubbing, cyanosis, or edema  Pacemaker interrogation- reviewed in detail today,  See PACEART report  ekg today reveals atrial paced rhythm 60 bpm,   Assessment and Plan:  1. Sinus bradycardia Normal pacemaker function See Pace Art report No changes today  2. Atrial flutter (atypical) Occurred in the setting of medical illness S/p cardioversion by Dr Shirlee LatchmcLean 04/12/16 On eliquis Could consider amiodarone for recurrent symptomatic arrhythmias She did not tolerate flecainide previously  3. HTN Stable No change  required today  Merlin Return in 6 months  Hillis RangeJames Samanda Buske MD, [facility]ACC 07/08/2016 11:58 AM

## 2016-07-08 NOTE — Patient Instructions (Signed)
Medication Instructions:   Your physician has recommended you make the following change in your medication:   Decrease toprol xl to 100 mg daily.   Continue all other medications the same.  Labwork:  NONE  Testing/Procedures:  NONE  Follow-Up:  Your physician recommends that you schedule a follow-up appointment in: 6 months with Dr. Johney FrameAllred. Please schedule this appointment today before you leave the office. Your device will be checked with this visit.  Any Other Special Instructions Will Be Listed Below (If Applicable).  If you need a refill on your cardiac medications before your next appointment, please call your pharmacy.

## 2016-07-11 LAB — CUP PACEART INCLINIC DEVICE CHECK
Battery Remaining Longevity: 68 mo
Battery Voltage: 2.9 V
Brady Statistic RA Percent Paced: 75 %
Brady Statistic RV Percent Paced: 12 %
Date Time Interrogation Session: 20180622154643
Implantable Lead Implant Date: 20110603
Implantable Lead Implant Date: 20110603
Implantable Lead Location: 753859
Implantable Lead Location: 753860
Lead Channel Impedance Value: 362.5 Ohm
Lead Channel Pacing Threshold Amplitude: 0.75 V
Lead Channel Pacing Threshold Pulse Width: 0.5 ms
Lead Channel Sensing Intrinsic Amplitude: 7.7 mV
Lead Channel Setting Pacing Amplitude: 2 V
Lead Channel Setting Pacing Amplitude: 2.5 V
Lead Channel Setting Sensing Sensitivity: 2 mV
MDC IDC MSMT LEADCHNL RA IMPEDANCE VALUE: 325 Ohm
MDC IDC MSMT LEADCHNL RA PACING THRESHOLD AMPLITUDE: 0.75 V
MDC IDC MSMT LEADCHNL RA PACING THRESHOLD PULSEWIDTH: 0.5 ms
MDC IDC MSMT LEADCHNL RA SENSING INTR AMPL: 1.8 mV
MDC IDC MSMT LEADCHNL RV PACING THRESHOLD AMPLITUDE: 0.75 V
MDC IDC MSMT LEADCHNL RV PACING THRESHOLD AMPLITUDE: 0.75 V
MDC IDC MSMT LEADCHNL RV PACING THRESHOLD PULSEWIDTH: 0.5 ms
MDC IDC MSMT LEADCHNL RV PACING THRESHOLD PULSEWIDTH: 0.5 ms
MDC IDC PG IMPLANT DT: 20110603
MDC IDC PG SERIAL: 7136330
MDC IDC SET LEADCHNL RV PACING PULSEWIDTH: 0.5 ms

## 2016-12-23 ENCOUNTER — Other Ambulatory Visit: Payer: Self-pay

## 2016-12-23 ENCOUNTER — Encounter: Payer: Medicare Other | Admitting: Internal Medicine

## 2016-12-23 ENCOUNTER — Encounter: Payer: Self-pay | Admitting: Internal Medicine

## 2016-12-23 ENCOUNTER — Ambulatory Visit (INDEPENDENT_AMBULATORY_CARE_PROVIDER_SITE_OTHER): Payer: Medicare Other | Admitting: Internal Medicine

## 2016-12-23 VITALS — BP 110/64 | HR 97 | Ht 61.0 in | Wt 140.0 lb

## 2016-12-23 DIAGNOSIS — I495 Sick sinus syndrome: Secondary | ICD-10-CM | POA: Diagnosis not present

## 2016-12-23 DIAGNOSIS — R001 Bradycardia, unspecified: Secondary | ICD-10-CM | POA: Diagnosis not present

## 2016-12-23 DIAGNOSIS — I484 Atypical atrial flutter: Secondary | ICD-10-CM | POA: Diagnosis not present

## 2016-12-23 NOTE — Patient Instructions (Addendum)
Medication Instructions:   Your physician recommends that you continue on your current medications as directed. Please refer to the Current Medication list given to you today.  Labwork:  NONE  Testing/Procedures:  NONE  Follow-Up: Your physician recommends that you schedule a follow-up appointment in: 9 months. Please schedule this appointment today before leaving the office.   Any Other Special Instructions Will Be Listed Below (If Applicable).  Your physician recommends that you schedule a follow-up appointment in: 6 months in the device clinic. You will receive a reminder letter in the mail in about 4 months reminding you to call and schedule your appointment. If you don't receive this letter, please contact our office.  If you need a refill on your cardiac medications before your next appointment, please call your pharmacy.

## 2016-12-23 NOTE — Progress Notes (Signed)
PCP: Romeo Rabonaplan, Michael, MD   Primary EP:  Dr Theodis BlazeAllred  Amber [name] is a 83 y.o. female who presents today for routine electrophysiology followup.  Since last being seen in our clinic, the patient reports doing very well.  Her primary concern is with back pain.  She spent the majority of her visit today asking me about "stem cell" injections for her back.  I told her that I was unable to provide comments related this this as this was outside of my area. Today, she denies symptoms of palpitations, chest pain, shortness of breath,  lower extremity edema, dizziness, presyncope, or syncope.  The patient is otherwise without complaint today.   Past Medical History:  Diagnosis Date  . Bradycardia    S/p SJM PPM 6/11  . History of recurrent vertebral fractures   . Hypertension   . Hyponatremia   . Iron deficiency anemia   . Pulmonary emboli (HCC) 01/01/2016  . Restless leg syndrome   . Right bundle branch block and left posterior fascicular block    Past Surgical History:  Procedure Laterality Date  . CARDIOVERSION N/A 04/12/2016   Procedure: CARDIOVERSION;  Surgeon: Laurey Moralealton S McLean, MD;  [facility];  Service: Cardiovascular;  Laterality: N/A;  . PACEMAKER INSERTION    . RIGHT LOWER LEG VEIN STRIPPING    . SJM PPM  06/11   by JA for SSS and syncope  . trigger finger surgery      ROS- all systems are reviewed and negative except as per HPI above  Current Outpatient Medications  Medication Sig Dispense Refill  . amLODipine (NORVASC) 10 MG tablet Take 10 mg by mouth daily.      Marland Kitchen. apixaban (ELIQUIS) 2.5 MG TABS tablet Take 2.5 mg by mouth 2 (two) times daily.    . Biotin 1 MG CAPS Take by mouth as directed.     . cholecalciferol (VITAMIN D) 1000 units tablet Take 1,000 Units by mouth daily.    . clonazePAM (KLONOPIN) 1 MG tablet Take 1 mg by mouth daily.      Marland Kitchen. escitalopram (LEXAPRO) 10 MG tablet Take 10 mg by mouth daily.    . fluticasone furoate-vilanterol (BREO ELLIPTA)  100-25 MCG/INH AEPB Inhale 1 puff into the lungs daily.    . irbesartan (AVAPRO) 75 MG tablet     . metoprolol succinate (TOPROL-XL) 100 MG 24 hr tablet Take 1 tablet (100 mg total) by mouth daily. Take with or immediately following a meal. 90 tablet 3  . Multiple Vitamin (MULTIVITAMIN) tablet Take 1 tablet by mouth daily.      . Multiple Vitamins-Minerals (PRESERVISION AREDS 2 PO) Take by mouth as directed.     . nitroGLYCERIN (NITROSTAT) 0.4 MG SL tablet Place 0.4 mg under the tongue every 5 (five) minutes as needed for chest pain.    . polyethylene glycol (MIRALAX / GLYCOLAX) packet Take 17 g by mouth daily.    . primidone (MYSOLINE) 50 MG tablet Take 75 mg by mouth daily.     . Probiotic Product (PROBIOTIC-10) CAPS Take 1 capsule by mouth at bedtime.     . traZODone (DESYREL) 50 MG tablet Take 1 tablet by mouth daily as needed for sleep.      No current facility-administered medications for this visit.     Physical Exam: Vitals:   12/23/16 0859  BP: 110/64  Pulse: 97  SpO2: 97%  Weight: 140 lb (63.5 kg)  Height: 5\' 1"  (1.549 m)  GEN- The patient is elderly appearing, alert and oriented x 3 today.   Head- normocephalic, atraumatic Eyes-  Sclera clear, conjunctiva pink Ears- hearing intact Oropharynx- clear Lungs- Clear to ausculation bilaterally, normal work of breathing Chest- pacemaker pocket is well healed Heart- irregular rate and rhythm  GI- soft, NT, ND, + BS Extremities- no clubbing, cyanosis, or edema  Pacemaker interrogation- reviewed in detail today,  See PACEART report    Assessment and Plan:  1. Symptomatic sinus bradycardia  Normal pacemaker function See Pace Art report No changes today Lead noise on atrial and ventricular leads.  She is not dependant or symptomatic.  Given advanced age, will follow clinically.  2. Atypical atrial flutter Rates are mostly controlled S/p cardioversion 3/18 Has not tolerated AAD therapy with flecainide On  eliquis Could consider amiodarone, though she would like to avoid this presently.  She is asymptomatic at this time.  3. HTN Stable No change required today  Merlin Return to see me in 9 months  Hillis RangeJames Arryn Terrones MD, [facility]ACC 12/23/2016 9:43 AM

## 2016-12-26 LAB — CUP PACEART INCLINIC DEVICE CHECK
Battery Voltage: 2.87 V
Brady Statistic RA Percent Paced: 75 %
Date Time Interrogation Session: 20181207151315
Implantable Lead Implant Date: 20110603
Implantable Lead Implant Date: 20110603
Implantable Lead Location: 753859
Implantable Lead Location: 753860
Implantable Pulse Generator Implant Date: 20110603
Lead Channel Impedance Value: 337.5 Ohm
Lead Channel Pacing Threshold Amplitude: 1 V
Lead Channel Pacing Threshold Pulse Width: 0.5 ms
Lead Channel Sensing Intrinsic Amplitude: 2.2 mV
Lead Channel Setting Pacing Amplitude: 2 V
Lead Channel Setting Pacing Amplitude: 2.5 V
Lead Channel Setting Pacing Pulse Width: 0.5 ms
MDC IDC MSMT BATTERY REMAINING LONGEVITY: 51 mo
MDC IDC MSMT LEADCHNL RV IMPEDANCE VALUE: 400 Ohm
MDC IDC MSMT LEADCHNL RV PACING THRESHOLD AMPLITUDE: 1 V
MDC IDC MSMT LEADCHNL RV PACING THRESHOLD PULSEWIDTH: 0.5 ms
MDC IDC MSMT LEADCHNL RV SENSING INTR AMPL: 9.2 mV
MDC IDC SET LEADCHNL RV SENSING SENSITIVITY: 2 mV
MDC IDC STAT BRADY RV PERCENT PACED: 30 %
Pulse Gen Model: 2210
Pulse Gen Serial Number: 7136330

## 2017-03-17 ENCOUNTER — Telehealth: Payer: Self-pay | Admitting: *Deleted

## 2017-03-17 NOTE — Telephone Encounter (Signed)
[facility] and she reported that BP was 142/75 this morning and patient was given prn clonidine. BP on recheck was 147/71. No reported s/s of chest pain, dizziness or sob. Amber SetaHeather did report that patient seemed to be more confused than usual and they are attempting to obtain a urine specimen for suspected UTI.

## 2017-03-17 NOTE — Telephone Encounter (Signed)
Patient contacted office requesting that Dr. Johney FrameAllred have [facility] [place] [facility] for elevated blood pressures ranging SBP close to 200's. [facility] [place] d/t elevated BP. Patient said that she also has a broken back and problems with her bowels that has been going on for over 4 years. Patient denied chest pain, dizziness or chest pain. Patient advised that if she had a medical emergency that she should go to the nearest hospital and advised that if she was stable, that she could go to the ED for an evaluation for emergencies. Patient advised that she should speak with the nurse that is caring for her today about her concerns. Patient verbalized understanding of plan. [facility] for more details r/e elevated BP.

## 2017-09-22 ENCOUNTER — Encounter: Payer: Medicare Other | Admitting: Internal Medicine

## 2017-09-22 ENCOUNTER — Encounter: Payer: Self-pay | Admitting: Internal Medicine

## 2017-10-31 ENCOUNTER — Encounter: Payer: Self-pay | Admitting: Internal Medicine

## 2018-01-26 ENCOUNTER — Ambulatory Visit (INDEPENDENT_AMBULATORY_CARE_PROVIDER_SITE_OTHER): Payer: Medicare Other | Admitting: Internal Medicine

## 2018-01-26 VITALS — BP 122/82 | HR 110 | Ht 61.0 in | Wt 131.0 lb

## 2018-01-26 DIAGNOSIS — I495 Sick sinus syndrome: Secondary | ICD-10-CM | POA: Diagnosis not present

## 2018-01-26 DIAGNOSIS — I10 Essential (primary) hypertension: Secondary | ICD-10-CM

## 2018-01-26 DIAGNOSIS — I484 Atypical atrial flutter: Secondary | ICD-10-CM

## 2018-01-26 NOTE — Patient Instructions (Addendum)
Your physician recommends that you schedule a follow-up appointment in: 1 YEAR WITH DR Johney Frame AND 6 MONTHS IN DEVICE CLINIC  Your physician recommends that you continue on your current medications as directed. Please refer to the Current Medication list given to you today.  Thank you for choosing [facility]!!

## 2018-01-26 NOTE — Progress Notes (Signed)
PCP: Romeo Rabon, MD   Primary EP:  Dr Theodis Blaze is a 85 y.o. female who presents today for routine electrophysiology followup.  Since last being seen in our clinic, the patient reports doing reasonable well.  She has had difficulty with back pain, sleeping, and confusion.  Her son is with her today. Today, she denies symptoms of palpitations, chest pain, shortness of breath,  lower extremity edema, dizziness, presyncope, or syncope.  The patient is otherwise without complaint today.   Past Medical History:  Diagnosis Date  . Bradycardia    S/p SJM PPM 6/11  . History of recurrent vertebral fractures   . Hypertension   . Hyponatremia   . Iron deficiency anemia   . Pulmonary emboli (HCC) 01/01/2016  . Restless leg syndrome   . Right bundle branch block and left posterior fascicular block    Past Surgical History:  Procedure Laterality Date  . CARDIOVERSION N/A 04/12/2016   Procedure: CARDIOVERSION;  Surgeon: Laurey Morale, MD;  Location: [facility] ENDOSCOPY;  Service: Cardiovascular;  Laterality: N/A;  . PACEMAKER INSERTION    . RIGHT LOWER LEG VEIN STRIPPING    . SJM PPM  06/11   by JA for SSS and syncope  . trigger finger surgery      ROS- all systems are reviewed and negative except as per HPI above  Current Outpatient Medications  Medication Sig Dispense Refill  . amLODipine (NORVASC) 10 MG tablet Take 10 mg by mouth daily.      Marland Kitchen apixaban (ELIQUIS) 2.5 MG TABS tablet Take 2.5 mg by mouth 2 (two) times daily.    . Biotin 1 MG CAPS Take by mouth as directed.     . cholecalciferol (VITAMIN D) 1000 units tablet Take 1,000 Units by mouth daily.    . clonazePAM (KLONOPIN) 1 MG tablet Take 1 mg by mouth daily.      Marland Kitchen escitalopram (LEXAPRO) 10 MG tablet Take 10 mg by mouth daily.    . fluticasone furoate-vilanterol (BREO ELLIPTA) 100-25 MCG/INH AEPB Inhale 1 puff into the lungs daily.    . irbesartan (AVAPRO) 75 MG tablet     . metoprolol succinate (TOPROL-XL) 100  MG 24 hr tablet Take 1 tablet (100 mg total) by mouth daily. Take with or immediately following a meal. 90 tablet 3  . Multiple Vitamin (MULTIVITAMIN) tablet Take 1 tablet by mouth daily.      . Multiple Vitamins-Minerals (PRESERVISION AREDS 2 PO) Take by mouth as directed.     . nitroGLYCERIN (NITROSTAT) 0.4 MG SL tablet Place 0.4 mg under the tongue every 5 (five) minutes as needed for chest pain.    . polyethylene glycol (MIRALAX / GLYCOLAX) packet Take 17 g by mouth daily.    . Probiotic Product (PROBIOTIC-10) CAPS Take 1 capsule by mouth at bedtime.     . traZODone (DESYREL) 50 MG tablet Take 1 tablet by mouth daily as needed for sleep.      No current facility-administered medications for this visit.     Physical Exam: Vitals:   01/26/18 1209  BP: 122/82  Pulse: (!) 110  SpO2: 93%  Weight: 131 lb (59.4 kg)  Height: 5\' 1"  (1.549 m)    GEN- The patient is elderly appearing, alert and oriented x 3 today.   Head- normocephalic, atraumatic Eyes-  Sclera clear, conjunctiva pink Ears- hearing is poor Oropharynx- clear Lungs- Clear to ausculation bilaterally, normal work of breathing Chest- pacemaker pocket is well healed  Heart- irregular rate and rhythm  GI- soft, NT, ND, + BS Extremities- no clubbing, cyanosis, trace edema  Pacemaker interrogation- reviewed in detail today,  See PACEART report   Assessment and Plan:  1. Symptomatic sinus bradycardia  Normal pacemaker function See Pace Art report No changes today Noise again noted on her A and V leads Reproducible with pocket manipulation Given advanced age and that she is not dependant, a conservative approach is best.  2. Atypical atria flutter S/p cardioversion 3/18 Continues to have frequent episodes Did not tolerate flecainide On eliquis Could consider amiodarone if needed As asymptomatic, I did not make changes today  3. HTN Stable No change required today  Device clinic in 6 months Return in a  year  Hillis RangeJames Infant Zink MD, [facility] 01/26/2018 12:34 PM

## 2018-01-29 LAB — CUP PACEART INCLINIC DEVICE CHECK
Battery Voltage: 2.8 V
Brady Statistic RA Percent Paced: 38 %
Brady Statistic RV Percent Paced: 10 %
Date Time Interrogation Session: 20200110183356
Implantable Lead Implant Date: 20110603
Implantable Lead Location: 753860
Lead Channel Impedance Value: 387.5 Ohm
Lead Channel Pacing Threshold Amplitude: 0.75 V
Lead Channel Sensing Intrinsic Amplitude: 2.3 mV
Lead Channel Sensing Intrinsic Amplitude: 6.7 mV
Lead Channel Setting Pacing Amplitude: 2.5 V
Lead Channel Setting Pacing Pulse Width: 0.5 ms
Lead Channel Setting Sensing Sensitivity: 2 mV
MDC IDC LEAD IMPLANT DT: 20110603
MDC IDC LEAD LOCATION: 753859
MDC IDC MSMT BATTERY REMAINING LONGEVITY: 28 mo
MDC IDC MSMT LEADCHNL RA IMPEDANCE VALUE: 312.5 Ohm
MDC IDC MSMT LEADCHNL RV PACING THRESHOLD PULSEWIDTH: 0.5 ms
MDC IDC PG IMPLANT DT: 20110603
MDC IDC SET LEADCHNL RA PACING AMPLITUDE: 2 V
Pulse Gen Serial Number: 7136330

## 2018-05-08 ENCOUNTER — Telehealth: Payer: Self-pay | Admitting: Internal Medicine

## 2018-05-08 NOTE — Telephone Encounter (Signed)
Heart Rate has been high, said he does not know the actual rate.  Has been going on since last night - no other symptoms.

## 2018-05-08 NOTE — Telephone Encounter (Signed)
Son called back to state HR back into 80's

## 2018-05-08 NOTE — Telephone Encounter (Signed)
Pt son says HR right now 112, started during the night last night and woke her up with mild chest pain and did not records HR at that time. Denies any active chest pain/dizziness/SOB. Is up doing household chores today and still says she can "feel her heartbeat". LOV HR was 110. Doesn't know what BP is running. Will forward to Dr Johney Frame

## 2018-05-09 NOTE — Telephone Encounter (Signed)
Im glad that her heart rates are better.  She should let us know if this continues to be an issue.

## 2018-05-11 NOTE — Telephone Encounter (Signed)
Son made aware of Dr.Allred's comment and thanks [place] all.

## 2018-08-13 ENCOUNTER — Telehealth: Payer: Self-pay

## 2018-08-13 NOTE — Telephone Encounter (Signed)
LMOVM

## 2018-08-14 ENCOUNTER — Ambulatory Visit (INDEPENDENT_AMBULATORY_CARE_PROVIDER_SITE_OTHER): Payer: Medicare Other | Admitting: *Deleted

## 2018-08-14 ENCOUNTER — Other Ambulatory Visit: Payer: Self-pay

## 2018-08-14 DIAGNOSIS — I495 Sick sinus syndrome: Secondary | ICD-10-CM

## 2018-08-14 LAB — CUP PACEART INCLINIC DEVICE CHECK
Battery Remaining Longevity: 15 mo
Battery Voltage: 2.72 V
Brady Statistic RA Percent Paced: 8.1 %
Brady Statistic RV Percent Paced: 4.3 %
Date Time Interrogation Session: 20200728152557
Implantable Lead Implant Date: 20110603
Implantable Lead Implant Date: 20110603
Implantable Lead Location: 753859
Implantable Lead Location: 753860
Implantable Pulse Generator Implant Date: 20110603
Lead Channel Impedance Value: 312.5 Ohm
Lead Channel Impedance Value: 400 Ohm
Lead Channel Pacing Threshold Amplitude: 0.75 V
Lead Channel Pacing Threshold Amplitude: 0.75 V
Lead Channel Pacing Threshold Pulse Width: 0.5 ms
Lead Channel Pacing Threshold Pulse Width: 0.5 ms
Lead Channel Sensing Intrinsic Amplitude: 2.1 mV
Lead Channel Sensing Intrinsic Amplitude: 8.1 mV
Lead Channel Setting Pacing Amplitude: 2 V
Lead Channel Setting Pacing Amplitude: 2.5 V
Lead Channel Setting Pacing Pulse Width: 0.5 ms
Lead Channel Setting Sensing Sensitivity: 2 mV
Pulse Gen Model: 2210
Pulse Gen Serial Number: 7136330

## 2018-08-14 NOTE — Progress Notes (Signed)
Pacemaker check in clinic. Normal device function. Thresholds, sensing, impedances consistent with previous measurements. Device programmed to maximize longevity. [place] entries, burden 90% + eliquis. 6 HVR episodes. Device programmed at appropriate safety margins. Histogram distribution appropriate for patient activity level. Device programmed to optimize intrinsic conduction. Estimated longevity 1.3 years. Patient scheduled for f/u 01/2019. Patient education completed.

## 2018-09-12 ENCOUNTER — Telehealth: Payer: Self-pay | Admitting: Internal Medicine

## 2018-09-12 NOTE — Telephone Encounter (Signed)
Dr Tori Milks called to ask about stopping patient's apixaban Amber [name])

## 2018-09-12 NOTE — Telephone Encounter (Signed)
Dr Salli Real (pt pcp) is recommending pt stop Eliquis since she had been therapeutic over a year and it at high risk for fall/age - wanted to check if Dr Rayann Heman was be agreeable to this

## 2018-09-12 NOTE — Telephone Encounter (Signed)
LMTCB

## 2018-09-12 NOTE — Telephone Encounter (Signed)
I would advise that she continue on Eliquis 2.5mg  BID.  I will defer to his decision if he fills risks outweight benefits.

## 2018-09-13 NOTE — Telephone Encounter (Signed)
Spoke with Shirlean Mylar at Dr Nelwyn Salisbury office who will relay the message

## 2018-10-08 ENCOUNTER — Telehealth: Payer: Self-pay | Admitting: Internal Medicine

## 2018-10-08 NOTE — Telephone Encounter (Signed)
I was contacted by Dr Titus Mould (Urologist in [place]) who would like to have Amber [name] evaluated.  She is well known to me.  She was scheduled for urologic botox for urinary incontinence this morning.  Her eliquis has been on hold for 5 days.   Prior to the procedure, she had chest pain and required sl NTG.  She is now pain free.  Her procedure has been cancelled.    I have advised that should she have any worsening symptoms that she should go to the ED locally. Resume eliquis I will arrange follow-up with general cardiology at the next available time. Given her advanced age, a conservative approach is best.  Thompson Grayer MD, [place] 10/08/2018 8:47 AM

## 2018-10-09 NOTE — Progress Notes (Signed)
CARDIOLOGY OFFICE NOTE  Date:  10/10/2018    Amber [name] Date of Birth: [birth year] Medical Record #[id number]  PCP:  Elyn Aquas  Cardiologist:  Allred   Chief Complaint  Patient presents with  . Chest Pain    History of Present Illness: Amber [name] is a 85 y.o. female who presents today for a work in visit. Seen for Dr. Rayann [name].  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   Last seen here in January - noted noise on her A and V leads - reproducible with pocket manipulation - not dependent on her device and a conservative approach was felt to be best. Noted that she has had recurrent atrial flutter - not symptomatic. Otherwise, she was felt to be doing ok.   Phone call from earlier this week to Dr. Rayann [name] - "I was contacted by Dr. Titus [name] (Urologist in [place]) who would like to have Amber [name] evaluated.  She is well known to me.  She was scheduled for urologic botox for urinary incontinence this morning.  Her eliquis has been on hold for 5 days.  Prior to the procedure, she had chest pain and required sl NTG.  She is now pain free.  Her procedure has been cancelled. I have advised that should she have any worsening symptoms that she should go to the ED locally. Resume eliquis. I will arrange follow-up with general cardiology at the next available time. Given her advanced age, a conservative approach is best".  Thus added to my schedule for today.   The patient does not have symptoms concerning for COVID-19 infection (fever, chills, cough, or new shortness of breath).   Comes in today. Here with her son. Had had prolonged episode of "severe chest pain" around 5:30 AM this past Monday - this was the day of her planned procedure - actually took a total of 5 NTG within the course- her pain eased up. Her  procedure was aborted. She is back on her Eliquis. She had had some recent PT - to help her balance - but this caused "heart pain" so she stopped doing that. She is moderately active. Nothing strenuous but she notes if she "walks too much" she will have "heart pain". She still tries to take care of her 10 room house. She has had some falls - no injury. Remains on Eliquis. Son notes that her heart rate is typically in the 110's. They check it with a pulse ox.   Past Medical History:  Diagnosis Date  . Bradycardia    S/p SJM PPM 6/11  . History of recurrent vertebral fractures   . Hypertension   . Hyponatremia   . Iron deficiency anemia   . Pulmonary emboli ([place]) 01/01/2016  . Restless leg syndrome   . Right bundle branch block and left posterior fascicular block     Past Surgical History:  Procedure Laterality Date  . CARDIOVERSION N/A 04/12/2016   Procedure: CARDIOVERSION;  Surgeon: Larey Dresser, MD;  Location: [facility];  Service: Cardiovascular;  Laterality: N/A;  . PACEMAKER INSERTION    . RIGHT LOWER LEG VEIN STRIPPING    . SJM PPM  06/11   by JA for SSS and syncope  . trigger finger surgery       Medications: Current Meds  Medication Sig  . amLODipine (NORVASC) 10  MG tablet Take 5 mg by mouth daily.    Marland Kitchen apixaban (ELIQUIS) 2.5 MG TABS tablet Take 2.5 mg by mouth 2 (two) times daily.  . clonazePAM (KLONOPIN) 1 MG tablet Take 1 mg by mouth daily.    Marland Kitchen escitalopram (LEXAPRO) 10 MG tablet Take 10 mg by mouth daily.  . irbesartan (AVAPRO) 75 MG tablet   . Multiple Vitamin (MULTIVITAMIN) tablet Take 1 tablet by mouth daily.    . Multiple Vitamins-Minerals (PRESERVISION AREDS 2 PO) Take by mouth as directed.   . nitroGLYCERIN (NITROSTAT) 0.4 MG SL tablet Place 0.4 mg under the tongue every 5 (five) minutes as needed for chest pain.  . polyethylene glycol (MIRALAX / GLYCOLAX) packet Take 17 g by mouth daily.  . Probiotic Product (PROBIOTIC-10) CAPS Take 1 capsule by mouth at  bedtime.   . traZODone (DESYREL) 50 MG tablet Take 1 tablet by mouth daily as needed for sleep.      Allergies: Allergies  Allergen Reactions  . Celecoxib     REACTION: hearing loss  . Epinephrine     unknown  . Quinine     REACTION: rash  . Sulfa Antibiotics     unknown  . Tramadol     unknown    Social History: The patient  reports that she has never smoked. She has never used smokeless tobacco. She reports that she does not drink alcohol or use drugs.   Family History: The patient's family history is not on file.   Review of Systems: Please see the history of present illness.   All other systems are reviewed and negative.   Physical Exam: VS:  BP 118/70   Pulse (!) 119   Ht 5\' 1"  (1.549 m)   Wt 128 lb 6.4 oz (58.2 kg)   SpO2 94%   BMI 24.26 kg/m  .  BMI Body mass index is 24.26 kg/m.  Wt Readings from Last 3 Encounters:  10/10/18 128 lb 6.4 oz (58.2 kg)  01/26/18 131 lb (59.4 kg)  12/23/16 140 lb (63.5 kg)    General: Pleasant. Elderly. Her weight is down a few pounds. She is [name] and in no acute distress.   HEENT: Normal.  Neck: Supple, no JVD, carotid bruits, or masses noted.  Cardiac: Regular rate and rhythm and a little fast.  No edema.  Respiratory:  Lungs are clear to auscultation bilaterally with normal work of breathing.  GI: Soft and nontender.  Amber: No deformity or atrophy. Gait and ROM intact.  Skin: Warm and dry. Color is normal.  Neuro:  Strength and sensation are intact and no gross focal deficits noted.  Psych: [name], appropriate and with normal affect.   LABORATORY DATA:  EKG:  EKG is ordered today. This demonstrates probable atrial flutter with a HR of 119.  Lab Results  Component Value Date   WBC 7.2 04/08/2016   HGB 11.6 04/08/2016   HCT 36.0 04/08/2016   PLT 205 04/08/2016   GLUCOSE 91 04/08/2016   NA 141 04/08/2016   K 5.2 04/08/2016   CL 99 04/08/2016   CREATININE 1.00 04/08/2016   BUN 29 04/08/2016   CO2 26 04/08/2016      BNP (last 3 results) No results for input(s): BNP in the last 8760 hours.  ProBNP (last 3 results) No results for input(s): PROBNP in the last 8760 hours.   Other Studies Reviewed Today:   Assessment/Plan:  1. Chest pain - prolonged spell earlier this week - has had prior -  typically with exertion - to the point that she stopped her PT - it was responsive to NTG - does sound like angina - will cut her Norvasc back and add Imdur 30 mg - hopefully her BP will stay unchanged. She was cautioned about headache. Would favor a conservative approach.   2. History of atrial flutter - with prior cardioversion - looks like she is known to have recurrence with a 90% burden from her check in July - and has not appeared to be symptomatic - she denies any palpitations at this time. She continues with conservative management. She was to be on Toprol - after out visit, it was brought to my attention that she may not be taking this - we have called - was not able to confirm until today - 10/11/18 - she apparently stopped this several months ago - it is not known why - son will be adding back.   3. Chronic anticoagulation - recheck baseline lab today. She has had some falls - no serious injury but explained that she is at increased risk for bleeding.   4. Underlying PPM - not dependent - followed by EP.   5. Urinary incontinence - would hold on her procedure for now.   6. Advanced age - still seems to be pretty good.   7. HTN - BP ok - see above changes. Will reassess if further changes needed on return.    7. COVID-19 Education: The signs and symptoms of COVID-19 were discussed with the patient and how to seek care for testing (follow up with PCP or arrange E-visit).  The importance of social distancing, staying at home, hand hygiene and wearing a mask when out in public were discussed today.  Current medicines are reviewed with the patient today.  The patient does not have concerns regarding  medicines other than what has been noted above.  The following changes have been made:  See above.  Labs/ tests ordered today include:    Orders Placed This Encounter  Procedures  . Basic metabolic panel  . CBC  . EKG 12-Lead     Disposition:   FU with [place] in about 4 to 6 weeks. I am happy to see her back. We could do a virtual visit with JA if she wishes as well.   Patient is agreeable to this plan and will call if any problems develop in the interim.   SignedNorma Fredrickson, NP  10/10/2018 4:23 PM  [facility] [address] [place], [place]  [address] Phone: [phone] Fax: [phone]

## 2018-10-10 ENCOUNTER — Ambulatory Visit (INDEPENDENT_AMBULATORY_CARE_PROVIDER_SITE_OTHER): Payer: Medicare Other | Admitting: Nurse Practitioner

## 2018-10-10 ENCOUNTER — Encounter: Payer: Self-pay | Admitting: Nurse Practitioner

## 2018-10-10 ENCOUNTER — Other Ambulatory Visit: Payer: Self-pay

## 2018-10-10 ENCOUNTER — Telehealth: Payer: Self-pay | Admitting: *Deleted

## 2018-10-10 VITALS — BP 118/70 | HR 119 | Ht 61.0 in | Wt 128.4 lb

## 2018-10-10 DIAGNOSIS — R001 Bradycardia, unspecified: Secondary | ICD-10-CM

## 2018-10-10 DIAGNOSIS — I10 Essential (primary) hypertension: Secondary | ICD-10-CM

## 2018-10-10 DIAGNOSIS — I484 Atypical atrial flutter: Secondary | ICD-10-CM | POA: Diagnosis not present

## 2018-10-10 DIAGNOSIS — R079 Chest pain, unspecified: Secondary | ICD-10-CM

## 2018-10-10 DIAGNOSIS — I495 Sick sinus syndrome: Secondary | ICD-10-CM

## 2018-10-10 DIAGNOSIS — R0789 Other chest pain: Secondary | ICD-10-CM

## 2018-10-10 MED ORDER — ISOSORBIDE MONONITRATE ER 30 MG PO TB24: 30.0000 mg | ORAL_TABLET | Freq: Every day | ORAL | 3 refills | Status: AC

## 2018-10-10 NOTE — Patient Instructions (Addendum)
After Visit Summary:  We will be checking the following labs today - BMET & CBC   Medication Instructions:    Continue with your current medicines. BUT  I am cutting the Norvasc back to just half a pill daily  I am adding Imdur 30 mg to take one a day - this can cause a headache - ok to use Tylenol. Use your NTG under your tongue for recurrent chest pain. May take one tablet every 5 minutes. If you are still having discomfort after 3 tablets in 15 minutes, call 911.   If you need a refill on your cardiac medications before your next appointment, please call your pharmacy.     Testing/Procedures To Be Arranged:  N/A  Follow-Up:   See me or Dr. Rayann Heman in about 6 weeks.     At [facility], you and your health needs are our priority.  As part of our continuing mission to provide you with exceptional heart care, we have created designated Provider Care Teams.  These Care Teams include your primary Cardiologist (physician) and Advanced Practice Providers (APPs -  Physician Assistants and Nurse Practitioners) who all work together to provide you with the care you need, when you need it.  Special Instructions:  . Stay safe, stay home, wash your hands for at least 20 seconds and wear a mask when out in public.  . It was good to talk with you both today.    Call the [place] office at [phone] if you have any questions, problems or concerns.

## 2018-10-10 NOTE — Telephone Encounter (Signed)
lvm for pt's son per (DPR) Gerald Stabs to call office to s/w Cecille Rubin about pt's medication.

## 2018-10-10 NOTE — Telephone Encounter (Signed)
Calling pt's son to answer Lori's questions; 1. Is pt really off the metoprolol succinate one tablet (100 mg ) daily 2.  If pt is off the metoprolol, Why??

## 2018-10-11 ENCOUNTER — Other Ambulatory Visit: Payer: Self-pay

## 2018-10-11 ENCOUNTER — Telehealth: Payer: Self-pay | Admitting: Nurse Practitioner

## 2018-10-11 LAB — CBC
Hematocrit: 34.2 % (ref 34.0–46.6)
Hemoglobin: 11.2 g/dL (ref 11.1–15.9)
MCH: 29.9 pg (ref 26.6–33.0)
MCHC: 32.7 g/dL (ref 31.5–35.7)
MCV: 91 fL (ref 79–97)
Platelets: 201 10*3/uL (ref 150–450)
RBC: 3.74 x10E6/uL — ABNORMAL LOW (ref 3.77–5.28)
RDW: 12.9 % (ref 11.7–15.4)
WBC: 5.8 10*3/uL (ref 3.4–10.8)

## 2018-10-11 LAB — BASIC METABOLIC PANEL
BUN/Creatinine Ratio: 20 (ref 12–28)
BUN: 26 mg/dL (ref 10–36)
CO2: 25 mmol/L (ref 20–29)
Calcium: 9.6 mg/dL (ref 8.7–10.3)
Chloride: 103 mmol/L (ref 96–106)
Creatinine, Ser: 1.27 mg/dL — ABNORMAL HIGH (ref 0.57–1.00)
GFR calc Af Amer: 42 mL/min/{1.73_m2} — ABNORMAL LOW (ref >59)
GFR calc non Af Amer: 36 mL/min/{1.73_m2} — ABNORMAL LOW (ref >59)
Glucose: 92 mg/dL (ref 65–99)
Potassium: 4.9 mmol/L (ref 3.5–5.2)
Sodium: 142 mmol/L (ref 134–144)

## 2018-10-11 MED ORDER — METOPROLOL SUCCINATE ER 100 MG PO TB24: 100.0000 mg | ORAL_TABLET | Freq: Every day | ORAL | 3 refills | Status: DC

## 2018-10-11 NOTE — Telephone Encounter (Signed)
° ° ° °*  STAT* If patient is at the pharmacy, call can be transferred to refill team.   1. Which medications need to be refilled? (please list name of each medication and dose if known) metoprolol succinate (TOPROL-XL) 100 MG 24 hr tablet 2. Which pharmacy/location (including street and city if local pharmacy) is medication to be sent to? [place], [place]  3. Do they need a 30 day or 90 day supply? [place]

## 2018-10-11 NOTE — Telephone Encounter (Signed)
S/w pt's son PER (DPR) stated pt has been off Toprol XL  (100 mg) for a year.  Pt does not know why pt stopped this medication. Asked what med was for and pt's  Son stated pt has several bottles at home and will start taking.  Will update medication list.

## 2018-10-12 MED ORDER — METOPROLOL SUCCINATE ER 100 MG PO TB24: 100.0000 mg | ORAL_TABLET | Freq: Every day | ORAL | 3 refills | Status: AC

## 2018-10-12 NOTE — Telephone Encounter (Signed)
Refill sent again

## 2018-11-07 NOTE — Progress Notes (Signed)
CARDIOLOGY OFFICE NOTE  Date:  11/12/2018    Amber Amber [name] Date of Birth: 1922/09/29 Medical Record #[id number]  PCP:  Amber Amber [name]  Cardiologist:  Amber Amber [name]   Chief Complaint  Patient presents with  . Follow-up    Seen for Dr. Rayann [name]    History of Present Illness: Amber Amber [name] is a 85 y.o. female who presents today for a follow up visit.  Seen for Dr. Rayann [name].  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   Last seen here in January - noted noise on her A and V leads - reproducible with pocket manipulation - not dependent on her device and a conservative approach was felt to be best. Noted that she has had recurrent atrial flutter - not symptomatic. Otherwise, she was felt to be doing ok.   I saw her last month after calling into the office - she was to have a urologic botox procedure and was holding her Eliquis - then informed staff of recurring chest pain needing NTG. Her procedure was cancelled and she was seen by me. She had used 5 NTG actually and with taking with her, was probably having other bouts of chest pain. HR was elevated. Unclear why she not on her beta blocker - this was restarted along with adding Imdur and adjusting her BP medicines.   The patient does not have symptoms concerning for COVID-19 infection (fever, chills, cough, or new shortness of breath).   Comes in today. Here with her son Amber Amber [name]. She is doing well. No more chest pain. She says she is scared to try and walk more. She is doing her housework - no issue. HR has come down a bit with restarting her Toprol. Her urinary procedure has been rescheduled for December.   Past Medical History:  Diagnosis Date  . Bradycardia    S/p SJM PPM 6/11  . History of recurrent vertebral fractures   . Hypertension   . Hyponatremia   .  Iron deficiency anemia   . Pulmonary emboli ([facility]) 01/01/2016  . Restless leg syndrome   . Right bundle branch block and left posterior fascicular block     Past Surgical History:  Procedure Laterality Date  . CARDIOVERSION N/A 04/12/2016   Procedure: CARDIOVERSION;  Surgeon: Larey Dresser, MD;  Location: [facility];  Service: Cardiovascular;  Laterality: N/A;  . PACEMAKER INSERTION    . RIGHT LOWER LEG VEIN STRIPPING    . SJM PPM  06/11   by JA for SSS and syncope  . trigger finger surgery       Medications: Current Meds  Medication Sig  . amLODipine (NORVASC) 10 MG tablet Take 5 mg by mouth daily.    Marland Kitchen apixaban (ELIQUIS) 2.5 MG TABS tablet Take 2.5 mg by mouth 2 (two) times daily.  . clonazePAM (KLONOPIN) 1 MG tablet Take 1 mg by mouth daily.    Marland Kitchen escitalopram (LEXAPRO) 10 MG tablet Take 10 mg by mouth daily.  . irbesartan (AVAPRO) 75 MG tablet   . isosorbide mononitrate (IMDUR) 30 MG 24 hr tablet Take 1 tablet (30 mg total) by mouth daily.  . metoprolol succinate (TOPROL-XL) 100 MG 24 hr tablet Take 1 tablet (100 mg total) by mouth daily. Take with or immediately following a meal.  . Multiple Vitamin (MULTIVITAMIN) tablet Take 1 tablet by  mouth daily.    . Multiple Vitamins-Minerals (PRESERVISION AREDS 2 PO) Take by mouth as directed.   . nitroGLYCERIN (NITROSTAT) 0.4 MG SL tablet Place 0.4 mg under the tongue every 5 (five) minutes as needed for chest pain.  . polyethylene glycol (MIRALAX / GLYCOLAX) packet Take 17 g by mouth daily.  . Probiotic Product (PROBIOTIC-10) CAPS Take 1 capsule by mouth at bedtime.   . [DISCONTINUED] traZODone (DESYREL) 50 MG tablet Take 1 tablet by mouth daily as needed for sleep.      Allergies: Allergies  Allergen Reactions  . Celecoxib     REACTION: hearing loss  . Epinephrine     unknown  . Quinine     REACTION: rash  . Sulfa Antibiotics     unknown  . Tramadol     unknown    Social History: The patient  reports that she has  never smoked. She has never used smokeless tobacco. She reports that she does not drink alcohol or use drugs.   Family History: The patient's family history is not on file.   Review of Systems: Please see the history of present illness.   All other systems are reviewed and negative.   Physical Exam: VS:  BP 120/76   Pulse (!) 109   Ht 5\' 1"  (1.549 m)   Wt 126 lb (57.2 kg)   SpO2 95%   BMI 23.81 kg/m  .  BMI Body mass index is 23.81 kg/m.  Wt Readings from Last 3 Encounters:  11/12/18 126 lb (57.2 kg)  10/10/18 128 lb 6.4 oz (58.2 kg)  01/26/18 131 lb (59.4 kg)    General: Pleasant. Elderly. Amber [name] and in no acute distress.  She is in a wheelchair.   HEENT: Normal.  Neck: Supple, no JVD, carotid bruits, or masses noted.  Cardiac: Irregular. Rate is about 96 by me.  No edema.  Respiratory:  Lungs are clear to auscultation bilaterally with normal work of breathing.  GI: Soft and nontender.  MS: No deformity or atrophy. Gait not tested.  Skin: Warm and dry. Color is normal.  Neuro:  Strength and sensation are intact and no gross focal deficits noted.  Psych: Amber [name], appropriate and with normal affect.   LABORATORY DATA:  EKG:  EKG is not ordered today.  Lab Results  Component Value Date   WBC 5.8 10/10/2018   HGB 11.2 10/10/2018   HCT 34.2 10/10/2018   PLT 201 10/10/2018   GLUCOSE 92 10/10/2018   NA 142 10/10/2018   K 4.9 10/10/2018   CL 103 10/10/2018   CREATININE 1.27 (H) 10/10/2018   BUN 26 10/10/2018   CO2 25 10/10/2018     BNP (last 3 results) No results for input(s): BNP in the last 8760 hours.  ProBNP (last 3 results) No results for input(s): PROBNP in the last 8760 hours.   Other Studies Reviewed Today:   Assessment/Plan:  1. Chest pain - sounded like angina - now on Imdur - favor conservative approach - she has improved clinically. Will leave her on her current regimen. Activity as tolerated.   2. History of atrial flutter - with prior  cardioversion - looks like she is known to have recurrence with a 90% burden from her check in July - now back on beta blocker therapy - HR has improved.   3. Chronic anticoagulation - has had some falls - not recurred. Will need to be monitored.   4. Underlying PPM - not dependent - followed by EP -  sees Dr. Johney Frame again in January.   5. Urinary incontinence - for her Botox procedure in December.   6. Advanced age - still seems to be pretty good.   7. HTN - BP is fine here today. No changes made.   8. COVID-19 Education: The signs and symptoms of COVID-19 were discussed with the patient and how to seek care for testing (follow up with PCP or arrange E-visit).  The importance of social distancing, staying at home, hand hygiene and wearing a mask when out in public were discussed today.  Current medicines are reviewed with the patient today.  The patient does not have concerns regarding medicines other than what has been noted above.  The following changes have been made:  See above.  Labs/ tests ordered today include:   No orders of the defined types were placed in this encounter.    Disposition:   FU with Dr. Johney Frame as planned in January. I am happy to see back as needed.   Patient is agreeable to this plan and will call if any problems develop in the interim.   SignedNorma Fredrickson, NP  11/12/2018 3:32 PM  [facility] [address] [place], [place]  [address] Phone: [phone] Fax: [phone]

## 2018-11-12 ENCOUNTER — Ambulatory Visit (INDEPENDENT_AMBULATORY_CARE_PROVIDER_SITE_OTHER): Payer: Medicare Other | Admitting: Nurse Practitioner

## 2018-11-12 ENCOUNTER — Other Ambulatory Visit: Payer: Self-pay

## 2018-11-12 ENCOUNTER — Encounter: Payer: Self-pay | Admitting: Nurse Practitioner

## 2018-11-12 VITALS — BP 120/76 | HR 109 | Ht 61.0 in | Wt 126.0 lb

## 2018-11-12 DIAGNOSIS — R001 Bradycardia, unspecified: Secondary | ICD-10-CM

## 2018-11-12 DIAGNOSIS — I484 Atypical atrial flutter: Secondary | ICD-10-CM

## 2018-11-12 DIAGNOSIS — R079 Chest pain, unspecified: Secondary | ICD-10-CM

## 2018-11-12 DIAGNOSIS — Z7189 Other specified counseling: Secondary | ICD-10-CM

## 2018-11-12 DIAGNOSIS — I10 Essential (primary) hypertension: Secondary | ICD-10-CM | POA: Diagnosis not present

## 2018-11-12 NOTE — Patient Instructions (Addendum)
After Visit Summary:  We will be checking the following labs today - NONE   Medication Instructions:    Continue with your current medicines.    If you need a refill on your cardiac medications before your next appointment, please call your pharmacy.     Testing/Procedures To Be Arranged:  N/A  Follow-Up:   See Dr. Rayann Heman in January as planned.     At [facility], you and your health needs are our priority.  As part of our continuing mission to provide you with exceptional heart care, we have created designated Provider Care Teams.  These Care Teams include your primary Cardiologist (physician) and Advanced Practice Providers (APPs -  Physician Assistants and Nurse Practitioners) who all work together to provide you with the care you need, when you need it.  Special Instructions:  . Stay safe, stay home, wash your hands for at least 20 seconds and wear a mask when out in public.  . It was good to talk with you today.    Call the [place] office at [phone] if you have any questions, problems or concerns.

## 2019-01-25 ENCOUNTER — Encounter: Payer: Self-pay | Admitting: Internal Medicine

## 2019-01-25 ENCOUNTER — Telehealth (INDEPENDENT_AMBULATORY_CARE_PROVIDER_SITE_OTHER): Payer: Medicare Other | Admitting: Internal Medicine

## 2019-01-25 VITALS — BP 125/85 | HR 88 | Wt 128.0 lb

## 2019-01-25 DIAGNOSIS — I10 Essential (primary) hypertension: Secondary | ICD-10-CM

## 2019-01-25 DIAGNOSIS — R079 Chest pain, unspecified: Secondary | ICD-10-CM

## 2019-01-25 DIAGNOSIS — I495 Sick sinus syndrome: Secondary | ICD-10-CM | POA: Diagnosis not present

## 2019-01-25 DIAGNOSIS — I484 Atypical atrial flutter: Secondary | ICD-10-CM

## 2019-01-25 NOTE — Progress Notes (Signed)
Electrophysiology TeleHealth Note  Due to national recommendations of social distancing due to COVID 19, an audio telehealth visit is felt to be most appropriate for this patient at this time.  Verbal consent was obtained by me for the telehealth visit today.  The patient does not have capability for a virtual visit.  A phone visit is therefore required today.   Date:  01/25/2019   ID:  Amber [name], DOB 1922-09-21, MRN [id number]  Location: patient's home  Provider location:  [place]  Evaluation Performed: Follow-up visit  PCP:  Joya Salm   Electrophysiologist:  Dr Johney Frame  Chief Complaint:  palpitations  History of Present Illness:    Amber [name] is a 86 y.o. female who presents via telehealth conferencing today.  Since last being seen in our clinic, the patient reports doing very well. s  She continues to drive and remains.  She is mostly staying at home due to COVID 19.  She had chest pain in October but has not had any since.  Her son states that the problem "self corrected".  Today, she denies symptoms of palpitations,shortness of breath,  lower extremity edema, dizziness, presyncope, or syncope.  The patient is otherwise without complaint today.  The patient denies symptoms of fevers, chills, cough, or new SOB worrisome for COVID 19.  Past Medical History:  Diagnosis Date  . Atrial flutter ([facility])   . Bradycardia    S/p SJM PPM 6/11  . History of recurrent vertebral fractures   . Hypertension   . Hyponatremia   . Iron deficiency anemia   . Pulmonary emboli ([facility]) 01/01/2016  . Restless leg syndrome   . Right bundle branch block and left posterior fascicular block     Past Surgical History:  Procedure Laterality Date  . CARDIOVERSION N/A 04/12/2016   Procedure: CARDIOVERSION;  Surgeon: Laurey Morale, MD;  Location: [facility];  Service: Cardiovascular;  Laterality: N/A;  . PACEMAKER INSERTION    . RIGHT LOWER LEG VEIN STRIPPING    . SJM PPM  06/11   by  JA for SSS and syncope  . trigger finger surgery      Current Outpatient Medications  Medication Sig Dispense Refill  . amLODipine (NORVASC) 10 MG tablet Take 5 mg by mouth daily.      Marland Kitchen apixaban (ELIQUIS) 2.5 MG TABS tablet Take 2.5 mg by mouth 2 (two) times daily.    . clonazePAM (KLONOPIN) 1 MG tablet Take 1 mg by mouth daily.      Marland Kitchen escitalopram (LEXAPRO) 10 MG tablet Take 10 mg by mouth daily.    . irbesartan (AVAPRO) 75 MG tablet     . isosorbide mononitrate (IMDUR) 30 MG 24 hr tablet Take 1 tablet (30 mg total) by mouth daily. 90 tablet 3  . metoprolol succinate (TOPROL-XL) 100 MG 24 hr tablet Take 1 tablet (100 mg total) by mouth daily. Take with or immediately following a meal. 90 tablet 3  . Multiple Vitamin (MULTIVITAMIN) tablet Take 1 tablet by mouth daily.      . Multiple Vitamins-Minerals (PRESERVISION AREDS 2 PO) Take by mouth as directed.     . nitroGLYCERIN (NITROSTAT) 0.4 MG SL tablet Place 0.4 mg under the tongue every 5 (five) minutes as needed for chest pain.    . polyethylene glycol (MIRALAX / GLYCOLAX) packet Take 17 g by mouth daily.    . Probiotic Product (PROBIOTIC-10) CAPS Take 1 capsule by mouth at bedtime.  No current facility-administered medications for this visit.    Allergies:   Celecoxib, Epinephrine, Quinine, Sulfa antibiotics, and Tramadol   Social History:  The patient  reports that she has never smoked. She has never used smokeless tobacco. She reports that she does not drink alcohol or use drugs.   Family History:   + HTN  ROS:  Please see the history of present illness.   All other systems are personally reviewed and negative.    Exam:    Vital Signs:  BP 125/85   Pulse 88   Wt 128 lb (58.1 kg)   SpO2 95%   BMI 24.19 kg/m   Well sounding, alert and conversant , poor hearing.  Her son assisted with the visit  Labs/Other Tests and Data Reviewed:    Recent Labs: 10/10/2018: BUN 26; Creatinine, Ser 1.27; Hemoglobin 11.2; Platelets  201; Potassium 4.9; Sodium 142   Wt Readings from Last 3 Encounters:  01/25/19 128 lb (58.1 kg)  11/12/18 126 lb (57.2 kg)  10/10/18 128 lb 6.4 oz (58.2 kg)    Last in office device check reviewed   ASSESSMENT & PLAN:    1.  Sick sinus syndrome Pacemaker with < 1 year estimated longevity We discussed at length today. She has a Location manager and her son Dellis Filbert) is willing to help with remotes to fufther evaluate the status of her battery.  I would like for her to begin remote monitoring through our clinic with monthly device checks once estimated longevity is < 6 months. Her son is motivated and willing to work with our team to make this happen, I did tell them to try to send me a remote today.  Once ERI, ok to schedule generator change with me without an additional visit as we did discuss risks and benefits to generator change at length today (including risks of bleeding and infection).  Hold eliquis only the morning of the procedure.  2. Atrial flutter (atypical) Chronic Reasonably well rate controlled chads2vasc score is at least 4 and she has also had prior PTE Continue long term anticoagulation  3. HTN Stable No change required today  4. Chest pain resolved  5. COVID vaccine I have advised that she receive the vaccine.  She and her son are working on this.  Follow-up:   with me in 4 months unless ERI prior to this time   Patient Risk:  after full review of this patients clinical status, I feel that they are at moderate risk at this time.  Today, I have spent 15 minutes with the patient with telehealth technology discussing arrhythmia management .    Army Fossa, MD  01/25/2019 10:39 AM     [facility] [address] [place] [address] [phone] (office) [phone] (fax)

## 2019-01-28 ENCOUNTER — Telehealth: Payer: Self-pay

## 2019-01-28 NOTE — Telephone Encounter (Signed)
I tried to help the pt send a transmission but was unsuccessful. I conference call the pt son and SJ they said the monitor is so old and not updated that it will be useless to use the monitor. They are over night the pt a new monitor. The pt should have the monitor by Wednesday at the latest. Sj asked the son to call when the equipment arrives and they will help set the monitor up and send a transmission. The pt son verbalized understanding.

## 2019-02-01 NOTE — Telephone Encounter (Signed)
I called to follow up to see if the pt got her new monitor. He states he got it set up and tried to send a transmission but it was unsuccessful. He states he was going to try again this afternoon. I told him if he has any issue with the monitor to call SJ. They will be able to help. The pt son Trey Paula verbalized understanding and thanked me for the call.

## 2019-02-07 NOTE — Telephone Encounter (Signed)
I spoke with Amber [name] and he states the pt needs a cell adapter. I told him I will mail him one. I also told him when he receive the cell adapter to call the office to let me know and I will help him. Amber [name] verbalized understanding and thanked me for the call.

## 2019-02-14 ENCOUNTER — Other Ambulatory Visit: Payer: Self-pay | Admitting: Internal Medicine

## 2019-02-14 NOTE — Telephone Encounter (Signed)
I spoke with Trey Paula the pt son and he states the pt is in the hospital with Covid-19. She has a touch of pneumonia, and SOB. She is not on a respirator. He is hoping she will be release from the hospital in a few days. He states once she is home they will send the transmission with the pt monitor.

## 2019-02-18 DEATH — deceased

## 2019-02-20 NOTE — Telephone Encounter (Signed)
Patient passed away on 02/22/2019. Dr. Johney Frame aware.

## 2019-03-18 DEATH — deceased

## 2019-05-24 ENCOUNTER — Encounter: Payer: Medicare Other | Admitting: Internal Medicine
